# Patient Record
Sex: Female | Born: 1955 | Hispanic: Yes | Marital: Married | Smoking: Never smoker
Health system: Southern US, Community
[De-identification: ages and names within clinical notes are randomized; demographics above are authoritative.]

## PROBLEM LIST (undated history)

## (undated) DIAGNOSIS — L509 Urticaria, unspecified: Secondary | ICD-10-CM

## (undated) DIAGNOSIS — T7840XA Allergy, unspecified, initial encounter: Secondary | ICD-10-CM

## (undated) DIAGNOSIS — K5792 Diverticulitis of intestine, part unspecified, without perforation or abscess without bleeding: Secondary | ICD-10-CM

## (undated) DIAGNOSIS — J45909 Unspecified asthma, uncomplicated: Secondary | ICD-10-CM

## (undated) DIAGNOSIS — I10 Essential (primary) hypertension: Secondary | ICD-10-CM

## (undated) HISTORY — DX: Urticaria, unspecified: L50.9

## (undated) HISTORY — DX: Diverticulitis of intestine, part unspecified, without perforation or abscess without bleeding: K57.92

## (undated) HISTORY — PX: TONSILLECTOMY: SUR1361

## (undated) HISTORY — DX: Essential (primary) hypertension: I10

## (undated) HISTORY — DX: Allergy, unspecified, initial encounter: T78.40XA

## (undated) HISTORY — DX: Unspecified asthma, uncomplicated: J45.909

---

## 2009-01-26 HISTORY — PX: ABDOMINAL HYSTERECTOMY: SHX81

## 2016-06-17 ENCOUNTER — Ambulatory Visit (INDEPENDENT_AMBULATORY_CARE_PROVIDER_SITE_OTHER): Payer: TRICARE For Life (TFL) | Admitting: Emergency Medicine

## 2016-06-17 VITALS — BP 130/80 | HR 72 | Temp 98.2°F | Resp 16 | Ht 60.0 in | Wt 150.2 lb

## 2016-06-17 DIAGNOSIS — E785 Hyperlipidemia, unspecified: Secondary | ICD-10-CM | POA: Diagnosis not present

## 2016-06-17 DIAGNOSIS — I10 Essential (primary) hypertension: Secondary | ICD-10-CM | POA: Diagnosis not present

## 2016-06-17 DIAGNOSIS — J3089 Other allergic rhinitis: Secondary | ICD-10-CM | POA: Diagnosis not present

## 2016-06-17 DIAGNOSIS — Z23 Encounter for immunization: Secondary | ICD-10-CM

## 2016-06-17 NOTE — Progress Notes (Addendum)
Subjective:    Patient ID: Lorraine Henderson, female    DOB: 01/29/1956, 60 y.o.   MRN: [id number]  HPI Here for referral to Allergist and to establish herself with PCP. Has no complaints. Stable chronic medical problems. HTN and dyslipidemia. Has chronic environmental-seasonal allergies.  Past Medical History:  Diagnosis Date  . Allergy   . Diverticulitis    colonoscopy 2 years ago: normal  . Hypertension    Past Surgical History:  Procedure Laterality Date  . ABDOMINAL HYSTERECTOMY    . TONSILLECTOMY Bilateral    Family History  Problem Relation Age of Onset  . Cancer Mother   . Diabetes Father   . Stroke Sister    Social History   Social History  . Marital status: Married    Spouse name: N/A  . Number of children: N/A  . Years of education: N/A   Occupational History  . Not on file.   Social History Main Topics  . Smoking status: Never Smoker  . Smokeless tobacco: Never Used  . Alcohol use No  . Drug use: No  . Sexual activity: Not on file   Other Topics Concern  . Not on file   Social History Narrative  . No narrative on file     Review of Systems Review of Systems  Constitutional: Negative.  Negative for activity change, appetite change, chills, fatigue and fever.  HENT: Negative.  Negative for congestion, ear pain, hearing loss, nosebleeds, sinus pain, sinus pressure, sore throat and trouble swallowing.   Eyes: Negative.  Negative for photophobia, pain and visual disturbance.  Respiratory: Negative.  Negative for cough, chest tightness and shortness of breath.   Cardiovascular: Negative.  Negative for chest pain, palpitations and leg swelling.  Gastrointestinal: Negative.  Negative for abdominal pain, blood in stool, nausea and vomiting.  Endocrine: Negative.  Negative for polydipsia, polyphagia and polyuria.  Genitourinary: Negative.  Negative for difficulty urinating, dysuria, frequency, hematuria and pelvic pain.  Musculoskeletal: Negative.   Negative for arthralgias, joint swelling, myalgias and neck pain.  Skin: Negative.  Negative for color change and rash.  Allergic/Immunologic: Positive for environmental allergies. Negative for food allergies.  Neurological: Negative.  Negative for dizziness, syncope, weakness and headaches.  Hematological: Negative.  Negative for adenopathy. Does not bruise/bleed easily.  Psychiatric/Behavioral: Negative.      Objective:   Physical Exam  Constitutional: She is oriented to person, place, and time. She appears well-developed and well-nourished. She is active and cooperative. No distress.  BP 130/80 (BP Location: Right Arm, Patient Position: Sitting, Cuff Size: Small)   Pulse 72   Temp 98.2 F (36.8 C) (Oral)   Resp 16   Ht 5' (1.524 m)   Wt 150 lb 3.2 oz (68.1 kg)   SpO2 98%   BMI 29.33 kg/m    HENT:  Head: Normocephalic and atraumatic.  Right Ear: Hearing normal.  Left Ear: Hearing normal.  Nose: Nose normal.  Mouth/Throat: Uvula is midline, oropharynx is clear and moist and mucous membranes are normal.  Eyes: Conjunctivae, EOM and lids are normal. Pupils are equal, round, and reactive to light.  Neck: Trachea normal and normal range of motion. Neck supple. Normal carotid pulses and no JVD present. Carotid bruit is not present. No thyroid mass and no thyromegaly present.  Cardiovascular: Normal rate, regular rhythm and normal heart sounds.   Pulmonary/Chest: Effort normal and breath sounds normal.  Abdominal: Soft. Normal appearance. There is no hepatosplenomegaly. There is no tenderness. There is no rebound  and no CVA tenderness.  Neurological: She is alert and oriented to person, place, and time. No cranial nerve deficit or sensory deficit.  Skin: Skin is warm and dry.  Psychiatric: She has a normal mood and affect. Her speech is not slurred.     BP 130/80 (BP Location: Right Arm, Patient Position: Sitting, Cuff Size: Small)   Pulse 72   Temp 98.2 F (36.8 C) (Oral)   Resp  16   Ht 5' (1.524 m)   Wt 150 lb 3.2 oz (68.1 kg)   SpO2 98%   BMI 29.33 kg/m   Physical Exam  Constitutional: She is oriented to person, place, and time. She appears well-developed and well-nourished.  HENT:  Head: Normocephalic and atraumatic.  Mouth/Throat: Oropharynx is clear and moist.  Eyes: Conjunctivae and EOM are normal. Pupils are equal, round, and reactive to light.  Neck: Normal range of motion. Neck supple. No JVD present. No thyromegaly present.  Cardiovascular: Normal rate, regular rhythm and normal heart sounds.  Exam reveals no gallop.   No murmur heard. Pulmonary/Chest: Effort normal and breath sounds normal. No respiratory distress. She has no wheezes. She has no rales.  Abdominal: Soft. Bowel sounds are normal. She exhibits no mass. There is no tenderness.  Musculoskeletal: Normal range of motion.  Neurological: She is alert and oriented to person, place, and time.  Skin: Skin is warm and dry.        Assessment & Plan:  Marylene Landngela was seen today for establish care.  Diagnoses and all orders for this visit:  Environmental and seasonal allergies -     Ambulatory referral to Allergy  Need for prophylactic vaccination and inoculation against influenza -     Flu Vaccine QUAD 36+ mos PF IM (Fluarix & Fluzone Quad PF)  Essential hypertension  Dyslipidemia

## 2016-06-17 NOTE — Progress Notes (Deleted)
Subjective:     Patient ID: Lorraine Henderson, female   DOB: July 31, 1955, 60 y.o.   MRN: [id number]  Needs referral to Allergist. Has no complaints.     Review of Systems  Constitutional: Negative.  Negative for activity change, appetite change, chills, fatigue and fever.  HENT: Negative.  Negative for congestion, ear pain, hearing loss, nosebleeds, sinus pain, sinus pressure, sore throat and trouble swallowing.   Eyes: Negative.  Negative for photophobia, pain and visual disturbance.  Respiratory: Negative.  Negative for cough, chest tightness and shortness of breath.   Cardiovascular: Negative.  Negative for chest pain, palpitations and leg swelling.  Gastrointestinal: Negative.  Negative for abdominal pain, blood in stool, nausea and vomiting.  Endocrine: Negative.  Negative for polydipsia, polyphagia and polyuria.  Genitourinary: Negative.  Negative for difficulty urinating, dysuria, frequency, hematuria and pelvic pain.  Musculoskeletal: Negative.  Negative for arthralgias, joint swelling, myalgias and neck pain.  Skin: Negative.  Negative for color change and rash.  Allergic/Immunologic: Positive for environmental allergies. Negative for food allergies.  Neurological: Negative.  Negative for dizziness, syncope, weakness and headaches.  Hematological: Negative.  Negative for adenopathy. Does not bruise/bleed easily.  Psychiatric/Behavioral: Negative.        Objective:   Physical Exam  Constitutional: She is oriented to person, place, and time. She appears well-developed and well-nourished.  HENT:  Head: Normocephalic and atraumatic.  Mouth/Throat: Oropharynx is clear and moist.  Eyes: Conjunctivae and EOM are normal. Pupils are equal, round, and reactive to light.  Neck: Normal range of motion. Neck supple. No JVD present. No thyromegaly present.  Cardiovascular: Normal rate, regular rhythm and normal heart sounds.  Exam reveals no gallop.   No murmur heard. Pulmonary/Chest:  Effort normal and breath sounds normal. No respiratory distress. She has no wheezes. She has no rales.  Abdominal: Soft. Bowel sounds are normal. She exhibits no mass. There is no tenderness.  Musculoskeletal: Normal range of motion.  Neurological: She is alert and oriented to person, place, and time.  Skin: Skin is warm and dry.       Assessment:     ***    Plan:     ***

## 2016-06-24 ENCOUNTER — Ambulatory Visit: Payer: Self-pay | Admitting: Allergy & Immunology

## 2016-06-24 ENCOUNTER — Ambulatory Visit (INDEPENDENT_AMBULATORY_CARE_PROVIDER_SITE_OTHER): Payer: TRICARE For Life (TFL) | Admitting: Allergy and Immunology

## 2016-06-24 ENCOUNTER — Encounter: Payer: Self-pay | Admitting: Allergy and Immunology

## 2016-06-24 VITALS — BP 110/64 | HR 72 | Temp 98.1°F | Resp 20 | Ht 59.33 in | Wt 148.8 lb

## 2016-06-24 DIAGNOSIS — J3089 Other allergic rhinitis: Secondary | ICD-10-CM | POA: Diagnosis not present

## 2016-06-24 DIAGNOSIS — Z91013 Allergy to seafood: Secondary | ICD-10-CM | POA: Diagnosis not present

## 2016-06-24 DIAGNOSIS — Z91012 Allergy to eggs: Secondary | ICD-10-CM

## 2016-06-24 NOTE — Progress Notes (Signed)
Dear Dr. Alvy BimlerSagardia,  Thank you for referring Lorraine Henderson to the [facility] [place] on 06/24/2016.   Below is a summation of this patient's evaluation and recommendations.  Thank you for your referral. I will keep you informed about this patient's response to treatment.   If you have any questions please do not hesitate to contact me.   Sincerely,  Jessica PriestEric J. Kozlow, MD  [facility]   ______________________________________________________________________    NEW PATIENT NOTE  Referring Provider: Georgina QuintSagardia, Miguel Jose, * Primary Provider: Georgina QuintMiguel Jose Sagardia, MD Date of office visit: 06/24/2016    Subjective:   Chief Complaint:  Lorraine Henderson (DOB: 1955/11/16) is a 60 y.o. female who presents to the clinic on 06/24/2016 with a chief complaint of sinus problems (itching, runny nose, stopped up) and Urticaria .     HPI: Lorraine Henderson presents to this clinic in evaluation of allergies.  She has a long history of allergic disease manifested as nasal symptoms with nasal congestion and sneezing but also with diffuse itchiness and apparently some degree of food hypersensitivity for which she was evaluated by an allergist while living in [place] and started on immunotherapy approximately 2-1/2 years ago. Her immunotherapy has progressed well and she is now receiving this form of therapy every 4 weeks. This has helped with her pruritic disorder and has also helped with her nasal disorder.  She remains away from egg consumption and shellfish consumption. She's been without these food products at least 5 years. With the consumption of shellfish she has developed swelling of her skin and throat and tongue. Egg consumption gave her itchiness and sneezing. She does have an EpiPen.  Lorraine Henderson has moved from [place] to the [place] area about 2-1/2 weeks ago. She is interested in continuing on her  immunotherapy.  Past Medical History:  Diagnosis Date  . Allergy   . Diverticulitis    colonoscopy 2 years ago: normal  . Hypertension   . Urticaria     Past Surgical History:  Procedure Laterality Date  . ABDOMINAL HYSTERECTOMY    . CESAREAN SECTION  1991 and 1996  . TONSILLECTOMY Bilateral     Allergies as of 06/24/2016      Reactions   Aloe Vera Swelling   Aspirin Swelling   Shrimp [shellfish Allergy] Swelling   Benicar Hct [olmesartan Medoxomil-hctz] Swelling      Medication List      atorvastatin 40 MG tablet Commonly known as:  LIPITOR Take 40 mg by mouth daily.   EPINEPHrine 0.3 mg/0.3 mL Soaj injection Commonly known as:  EPI-PEN Inject 0.3 mg into the muscle once.   fexofenadine 180 MG tablet Commonly known as:  ALLEGRA Take 180 mg by mouth daily.   hydrochlorothiazide 25 MG tablet Commonly known as:  HYDRODIURIL Take 25 mg by mouth daily.       Review of systems negative except as noted in HPI / PMHx or noted below:  Review of Systems  Constitutional: Negative.   HENT: Negative.   Eyes: Negative.   Respiratory: Negative.   Cardiovascular: Negative.   Gastrointestinal: Negative.   Genitourinary: Negative.   Musculoskeletal: Negative.   Skin: Negative.   Neurological: Negative.   Endo/Heme/Allergies: Negative.   Psychiatric/Behavioral: Negative.     Family History  Problem Relation Age of Onset  . Cancer Mother   . Diabetes Father   . Stroke Sister     Social History  Social History  . Marital status: Married    Spouse name: N/A  . Number of children: N/A  . Years of education: N/A   Occupational History  . Not on file.   Social History Main Topics  . Smoking status: Never Smoker  . Smokeless tobacco: Never Used  . Alcohol use No  . Drug use: No  . Sexual activity: Not on file   Other Topics Concern  . Not on file   Social History Narrative  . No narrative on file    Environmental and Social history  Lives in  a apartment with a dry environment, a dog located inside the household, rugs in the bedroom, no plastic on the bed or pillow, and no smoking ongoing with inside the household.  Objective:   Vitals:   06/24/16 1338  BP: 110/64  Pulse: 72  Resp: 20  Temp: 98.1 F (36.7 C)   Height: 4' 11.33" (150.7 cm) Weight: 148 lb 12.8 oz (67.5 kg)  Physical Exam  Constitutional: She is well-developed, well-nourished, and in no distress.  HENT:  Head: Normocephalic. Head is without right periorbital erythema and without left periorbital erythema.  Right Ear: Tympanic membrane, external ear and ear canal normal.  Left Ear: Tympanic membrane, external ear and ear canal normal.  Nose: Nose normal. No mucosal edema or rhinorrhea.  Mouth/Throat: Uvula is midline, oropharynx is clear and moist and mucous membranes are normal. No oropharyngeal exudate.  Eyes: Conjunctivae and lids are normal. Pupils are equal, round, and reactive to light.  Neck: Trachea normal. No tracheal tenderness present. No tracheal deviation present. No thyromegaly present.  Cardiovascular: Normal rate, regular rhythm, S1 normal, S2 normal and normal heart sounds.   No murmur heard. Pulmonary/Chest: Effort normal and breath sounds normal. No stridor. No tachypnea. No respiratory distress. She has no wheezes. She has no rales. She exhibits no tenderness.  Abdominal: Soft. She exhibits no distension and no mass. There is no hepatosplenomegaly. There is no tenderness. There is no rebound and no guarding.  Musculoskeletal: She exhibits no edema or tenderness.  Lymphadenopathy:       Head (right side): No tonsillar adenopathy present.       Head (left side): No tonsillar adenopathy present.    She has no cervical adenopathy.    She has no axillary adenopathy.  Neurological: She is alert. Gait normal.  Skin: No rash noted. She is not diaphoretic. No erythema. No pallor. Nails show no clubbing.  Psychiatric: Mood and affect normal.     Diagnostics: Allergy skin tests were not performed.   Assessment and Plan:    1. Other allergic rhinitis   2. Egg allergy   3. Shellfish allergy     1. Continue immunotherapy from [place] allergist every month  2. Continue EpiPen if needed  3. Continue OTC antihistamine if needed  4. Return to clinic 1 year or earlier if problem  At this point in time Lorraine Henderson will continue to use immunotherapy as formulated by her allergist in [place]. If for some reason she does not do well after living in this area for a point in time we will consider having her start immunotherapy more specific to this area. Of course she is going to avoid egg and shellfish consumption based upon her previous reaction to these food substances. I will see her back in this clinic in 1 year or earlier if there is a problem.  Jessica PriestEric J. Kozlow, MD [facility] [place]  [place]

## 2016-06-24 NOTE — Patient Instructions (Addendum)
  1. Continue immunotherapy from [place] allergist every month  2. Continue EpiPen if needed  3. Continue OTC antihistamine if needed  4. Return to clinic 1 year or earlier if problem

## 2016-07-05 ENCOUNTER — Ambulatory Visit (INDEPENDENT_AMBULATORY_CARE_PROVIDER_SITE_OTHER): Payer: TRICARE For Life (TFL) | Admitting: *Deleted

## 2016-07-05 DIAGNOSIS — J3089 Other allergic rhinitis: Secondary | ICD-10-CM | POA: Diagnosis not present

## 2016-07-21 ENCOUNTER — Ambulatory Visit (INDEPENDENT_AMBULATORY_CARE_PROVIDER_SITE_OTHER): Payer: TRICARE For Life (TFL)

## 2016-07-21 DIAGNOSIS — J309 Allergic rhinitis, unspecified: Secondary | ICD-10-CM

## 2016-07-27 ENCOUNTER — Ambulatory Visit: Payer: Self-pay | Admitting: Allergy and Immunology

## 2016-08-05 ENCOUNTER — Ambulatory Visit (INDEPENDENT_AMBULATORY_CARE_PROVIDER_SITE_OTHER): Payer: TRICARE For Life (TFL)

## 2016-08-05 DIAGNOSIS — J309 Allergic rhinitis, unspecified: Secondary | ICD-10-CM

## 2016-08-24 ENCOUNTER — Ambulatory Visit (INDEPENDENT_AMBULATORY_CARE_PROVIDER_SITE_OTHER): Payer: TRICARE For Life (TFL) | Admitting: *Deleted

## 2016-08-24 DIAGNOSIS — J309 Allergic rhinitis, unspecified: Secondary | ICD-10-CM | POA: Diagnosis not present

## 2016-09-10 ENCOUNTER — Ambulatory Visit (INDEPENDENT_AMBULATORY_CARE_PROVIDER_SITE_OTHER): Payer: TRICARE For Life (TFL) | Admitting: *Deleted

## 2016-09-10 DIAGNOSIS — J309 Allergic rhinitis, unspecified: Secondary | ICD-10-CM

## 2016-09-20 ENCOUNTER — Ambulatory Visit (INDEPENDENT_AMBULATORY_CARE_PROVIDER_SITE_OTHER): Payer: TRICARE For Life (TFL) | Admitting: Emergency Medicine

## 2016-09-20 ENCOUNTER — Encounter: Payer: Self-pay | Admitting: Emergency Medicine

## 2016-09-20 VITALS — BP 156/89 | HR 83 | Resp 16 | Wt 148.0 lb

## 2016-09-20 DIAGNOSIS — I10 Essential (primary) hypertension: Secondary | ICD-10-CM

## 2016-09-20 DIAGNOSIS — E785 Hyperlipidemia, unspecified: Secondary | ICD-10-CM | POA: Diagnosis not present

## 2016-09-20 MED ORDER — ATORVASTATIN CALCIUM 40 MG PO TABS: 40.0000 mg | ORAL_TABLET | Freq: Every day | ORAL | 3 refills | Status: AC

## 2016-09-20 MED ORDER — HYDROCHLOROTHIAZIDE 25 MG PO TABS: 25.0000 mg | ORAL_TABLET | Freq: Every day | ORAL | 3 refills | Status: AC

## 2016-09-20 NOTE — Progress Notes (Signed)
Lorraine Henderson 61 y.o.   Chief Complaint  Patient presents with  . Follow-up    b/p check    HISTORY OF PRESENT ILLNESS: This is a 61 y.o. female here for BP check; no significant complaints. Doing well.  HPI   Prior to Admission medications   Medication Sig Start Date End Date Taking? Authorizing Provider  atorvastatin (LIPITOR) 40 MG tablet Take 1 tablet (40 mg total) by mouth daily. 09/20/16 12/19/16 Yes Lorraine Linz Victorino December, MD  EPINEPHrine 0.3 mg/0.3 mL IJ SOAJ injection Inject 0.3 mg into the muscle once.   Yes Historical Provider, MD  fexofenadine (ALLEGRA) 180 MG tablet Take 180 mg by mouth daily.   Yes Historical Provider, MD  hydrochlorothiazide (HYDRODIURIL) 25 MG tablet Take 1 tablet (25 mg total) by mouth daily. 09/20/16 12/19/16 Yes Lorraine Blevens Victorino December, MD    Allergies  Allergen Reactions  . Aloe Vera Swelling  . Aspirin Swelling  . Shrimp [Shellfish Allergy] Swelling  . Benicar Hct [Olmesartan Medoxomil-Hctz] Swelling    There are no active problems to display for this patient.   Past Medical History:  Diagnosis Date  . Allergy   . Diverticulitis    colonoscopy 2 years ago: normal  . Hypertension   . Urticaria     Past Surgical History:  Procedure Laterality Date  . ABDOMINAL HYSTERECTOMY    . CESAREAN SECTION  1991 and 1996  . TONSILLECTOMY Bilateral     Social History   Social History  . Marital status: Married    Spouse name: N/A  . Number of children: N/A  . Years of education: N/A   Occupational History  . Not on file.   Social History Main Topics  . Smoking status: Never Smoker  . Smokeless tobacco: Never Used  . Alcohol use No  . Drug use: No  . Sexual activity: Not on file   Other Topics Concern  . Not on file   Social History Narrative  . No narrative on file    Family History  Problem Relation Age of Onset  . Cancer Mother   . Diabetes Father   . Stroke Sister      Review of Systems  Constitutional: Negative.     HENT: Negative.   Eyes: Negative.   Respiratory: Negative.   Cardiovascular: Negative.   Gastrointestinal: Negative.   Genitourinary: Negative.   Musculoskeletal: Positive for joint pain (right knee pain).  Skin: Negative.   Neurological: Negative.   Endo/Heme/Allergies: Negative.   Psychiatric/Behavioral: Negative.   All other systems reviewed and are negative.  Vitals:   09/20/16 1028  BP: (!) 156/89  Pulse: 83  Resp: 16     Physical Exam  Constitutional: She is oriented to person, place, and time. She appears well-developed and well-nourished.  HENT:  Head: Normocephalic and atraumatic.  Nose: Nose normal.  Mouth/Throat: Oropharynx is clear and moist.  Eyes: Conjunctivae and EOM are normal. Pupils are equal, round, and reactive to light.  Neck: Normal range of motion. Neck supple. No JVD present. No thyromegaly present.  Cardiovascular: Normal rate, regular rhythm and normal heart sounds.   Pulmonary/Chest: Effort normal and breath sounds normal.  Abdominal: Soft. Bowel sounds are normal.  Musculoskeletal: Normal range of motion.  Right knee: no swelling or tenderness; FROM; stable in flexion and extension.  Lymphadenopathy:    She has no cervical adenopathy.  Neurological: She is alert and oriented to person, place, and time. She displays normal reflexes. No sensory deficit. She exhibits normal  muscle tone.  Skin: Skin is warm and dry.  Psychiatric: She has a normal mood and affect. Her behavior is normal.  Vitals reviewed.    ASSESSMENT & PLAN: Lorraine Landngela was seen today for follow-up.  Diagnoses and all orders for this visit:  Essential hypertension -     Comprehensive metabolic panel -     CBC with Differential/Platelet -     TSH -     Lipid panel  Dyslipidemia  Other orders -     hydrochlorothiazide (HYDRODIURIL) 25 MG tablet; Take 1 tablet (25 mg total) by mouth daily. -     atorvastatin (LIPITOR) 40 MG tablet; Take 1 tablet (40 mg total) by mouth  daily.    Patient Instructions       IF you received an x-ray today, you will receive an invoice from [facility]. Please contact [facility] at [phone] with questions or concerns regarding your invoice.   IF you received labwork today, you will receive an invoice from [facility]. Please contact [facility] at [phone] with questions or concerns regarding your invoice.   Our billing staff will not be able to assist you with questions regarding bills from these companies.  You will be contacted with the lab results as soon as they are available. The fastest way to get your results is to activate your My Chart account. Instructions are located on the last page of this paperwork. If you have not heard from us regarding the results in 2 weeks, please contact this office.     Hipertensin Hypertension El trmino hipertensin es otra forma de denominar a la presin arterial elevada. La presin arterial elevada fuerza al corazn a trabajar ms para bombear la sangre. Esto puede causar problemas con el paso del [place]. Una lectura de presin arterial est compuesta por 2 nmeros. Hay un nmero superior (sistlico) sobre un nmero inferior (diastlico). Lo ideal es tener la presin arterial por debajo de 120/80. Las decisiones saludables pueden ayudarle a disminuir su presin arterial. Es posible que necesite medicamentos que le ayuden a disminuir su presin arterial si:  Su presin arterial no disminuye mediante decisiones saludables.  Su presin arterial est por encima de 130/80. Siga estas instrucciones en su casa: Comida y bebida   Si se lo indican, siga el plan de alimentacin de DASH (Dietary Approaches to Stop Hypertension, Maneras de alimentarse para detener la hipertensin). Esta dieta incluye:  Que la mitad del plato de cada comida sea de frutas y verduras.  Que un cuarto del plato de cada comida sea de cereales integrales. Los cereales integrales incluyen  pasta integral, arroz integral y pan integral.  Comer y beber productos lcteos con bajo contenido de [place], como leche descremada o yogur bajo en grasas.  Que un cuarto del plato de cada comida sea de protenas bajas en grasa [place](magras). Las protenas bajas en grasa incluyen pescado, pollo sin piel, huevos, frijoles y tofu.  Evitar consumir carne grasa, carne curada y procesada, o pollo con piel.  Evitar consumir alimentos prehechos o procesados.  Consuma menos de 1500 mg de sal (sodio) por da.  Limite el consumo de alcohol a no ms de 1 medida por da si es mujer y no est Orthoptistembarazada y a 2 medidas por da si es hombre. Una medida equivale a 12onzas de cerveza, 5onzas de vino o 1onzas de bebidas alcohlicas de alta graduacin. Estilo de vida   Trabaje con su mdico para mantenerse en un peso saludable o para perder peso. Pregntele a  su mdico cul es el peso recomendable para usted.  Realice al menos 30 minutos de ejercicio que haga que se acelere su corazn (ejercicio Magazine features editor) la [employer] de la [place]. Estos pueden incluir caminar, nadar o andar en bicicleta.  Realice al menos 30 minutos de ejercicio que fortalezca sus msculos (ejercicios de resistencia) al menos 3 das a la [place]. Estos pueden incluir levantar pesas o hacer pilates.  No consuma ningn producto que contenga nicotina o tabaco. Esto incluye cigarrillos y cigarrillos electrnicos. Si necesita ayuda para dejar de fumar, consulte al [employer].  Controle su presin arterial en su casa tal como le indic el mdico.  Concurra a todas las visitas de control como se lo haya indicado el mdico. Esto es importante. Medicamentos   [employer] de venta libre y los recetados solamente como se lo haya indicado el mdico. Siga cuidadosamente las indicaciones.  No omita las dosis de medicamentos para la presin arterial. Los medicamentos pierden eficacia si omite dosis. El hecho de omitir las dosis tambin  [place] el riesgo de otros problemas.  Pregntele a su mdico a qu efectos secundarios o reacciones a los Museum/gallery curator. Comunquese con un mdico si:  Piensa que tiene [place] reaccin a los medicamentos que est tomando.  Tiene dolores de cabeza frecuentes (recurrentes).  Siente mareos.  Tiene hinchazn en los tobillos.  Tiene problemas de visin. Solicite ayuda de inmediato si:  Siente un dolor de cabeza muy intenso.  Comienza a sentirse confundido.  Se siente dbil o adormecido.  Siente que va a desmayarse.  Siente un dolor muy intenso en:  El pecho.  El vientre (abdomen).  Devuelve (vomita) ms de una vez.  Tiene dificultad para respirar. Resumen  El trmino hipertensin es otra forma de denominar a la presin arterial elevada.  Las decisiones saludables pueden ayudarle a disminuir su presin arterial. Si no puede controlar su presin arterial mediante decisiones saludables, es posible que deba tomar medicamentos. Esta informacin no tiene Theme park manager el consejo del mdico. Asegrese de hacerle al mdico cualquier pregunta que tenga. Document Released: 12/02/2009 Document Revised: 05/26/2016 Document Reviewed: 05/26/2016 Elsevier Interactive Patient Education  2017 Elsevier Inc.      Edwina Barth, MD [facility]

## 2016-09-20 NOTE — Patient Instructions (Addendum)
IF you received an x-ray today, you will receive an invoice from [facility]. Please contact [facility] at [phone] with questions or concerns regarding your invoice.   IF you received labwork today, you will receive an invoice from [facility]. Please contact [facility] at [phone] with questions or concerns regarding your invoice.   Our billing staff will not be able to assist you with questions regarding bills from these companies.  You will be contacted with the lab results as soon as they are available. The fastest way to get your results is to activate your My Chart account. Instructions are located on the last page of this paperwork. If you have not heard from us regarding the results in 2 weeks, please contact this office.     Hipertensin Hypertension El trmino hipertensin es otra forma de denominar a la presin arterial elevada. La presin arterial elevada fuerza al corazn a trabajar ms para bombear la sangre. Esto puede causar problemas con el paso del [place]po. Una lectura de presin arterial est compuesta por 2 nmeros. Hay un nmero superior (sistlico) sobre un nmero inferior (diastlico). Lo ideal es tener la presin arterial por debajo de 120/80. Las decisiones saludables pueden ayudarle a disminuir su presin arterial. Es posible que necesite medicamentos que le ayuden a disminuir su presin arterial si:  Su presin arterial no disminuye mediante decisiones saludables.  Su presin arterial est por encima de 130/80. Siga estas instrucciones en su casa: Comida y bebida   Si se lo indican, siga el plan de alimentacin de DASH (Dietary Approaches to Stop Hypertension, Maneras de alimentarse para detener la hipertensin). Esta dieta incluye:  Que la mitad del plato de cada comida sea de frutas y verduras.  Que un cuarto del plato de cada comida sea de cereales integrales. Los cereales integrales incluyen pasta integral, arroz integral y pan  integral.  Comer y beber productos lcteos con bajo contenido de [place]rasa, como leche descremada o yogur bajo en grasas.  Que un cuarto del plato de cada comida sea de protenas bajas en grasa [place](magras). Las protenas bajas en grasa incluyen pescado, pollo sin piel, huevos, frijoles y tofu.  Evitar consumir carne grasa, carne curada y procesada, o pollo con piel.  Evitar consumir alimentos prehechos o procesados.  Consuma menos de 1500 mg de sal (sodio) por da.  Limite el consumo de alcohol a no ms de 1 medida por da si es mujer y no est Orthoptistembarazada y a 2 medidas por da si es hombre. Una medida equivale a 12onzas de cerveza, 5onzas de vino o 1onzas de bebidas alcohlicas de alta graduacin. Estilo de vida   Trabaje con su mdico para mantenerse en un peso saludable o para perder peso. Pregntele a su mdico cul es el peso recomendable para usted.  Realice al menos 30 minutos de ejercicio que haga que se acelere su corazn (ejercicio Magazine features editoraerbico) la DIRECTVmayora de los das de la [place]. Estos pueden incluir caminar, nadar o andar en bicicleta.  Realice al menos 30 minutos de ejercicio que fortalezca sus msculos (ejercicios de resistencia) al menos 3 das a la [place]. Estos pueden incluir levantar pesas o hacer pilates.  No consuma ningn producto que contenga nicotina o tabaco. Esto incluye cigarrillos y cigarrillos electrnicos. Si necesita ayuda para dejar de fumar, consulte al mdico.  Controle su presin arterial en su casa tal como le indic el mdico.  Concurra a todas las visitas de control como se lo haya indicado el mdico. Esto  es importante. Medicamentos   Bax[employer] Internationalome los medicamentos de venta libre y los recetados solamente como se lo haya indicado el mdico. Siga cuidadosamente las indicaciones.  No omita las dosis de medicamentos para la presin arterial. Los medicamentos pierden eficacia si omite dosis. El hecho de omitir las dosis tambin [place]umenta el riesgo de otros  problemas.  Pregntele a su mdico a qu efectos secundarios o reacciones a los Museum/gallery curatormedicamentos debe prestar atencin. Comunquese con un mdico si:  Piensa que tiene [place] reaccin a los medicamentos que est tomando.  Tiene dolores de cabeza frecuentes (recurrentes).  Siente mareos.  Tiene hinchazn en los tobillos.  Tiene problemas de visin. Solicite ayuda de inmediato si:  Siente un dolor de cabeza muy intenso.  Comienza a sentirse confundido.  Se siente dbil o adormecido.  Siente que va a desmayarse.  Siente un dolor muy intenso en:  El pecho.  El vientre (abdomen).  Devuelve (vomita) ms de una vez.  Tiene dificultad para respirar. Resumen  El trmino hipertensin es otra forma de denominar a la presin arterial elevada.  Las decisiones saludables pueden ayudarle a disminuir su presin arterial. Si no puede controlar su presin arterial mediante decisiones saludables, es posible que deba tomar medicamentos. Esta informacin no tiene Theme park managercomo fin reemplazar el consejo del mdico. Asegrese de hacerle al mdico cualquier pregunta que tenga. Document Released: 12/02/2009 Document Revised: 05/26/2016 Document Reviewed: 05/26/2016 Elsevier Interactive Patient Education  2017 [employer]

## 2016-09-21 LAB — CBC WITH DIFFERENTIAL/PLATELET
BASOS ABS: 0 10*3/uL (ref 0.0–0.2)
Basos: 1 %
EOS (ABSOLUTE): 0.3 10*3/uL (ref 0.0–0.4)
Eos: 4 %
Hematocrit: 42.3 % (ref 34.0–46.6)
Hemoglobin: 14.2 g/dL (ref 11.1–15.9)
IMMATURE GRANULOCYTES: 0 %
Immature Grans (Abs): 0 10*3/uL (ref 0.0–0.1)
Lymphocytes Absolute: 3 10*3/uL (ref 0.7–3.1)
Lymphs: 40 %
MCH: 31.1 pg (ref 26.6–33.0)
MCHC: 33.6 g/dL (ref 31.5–35.7)
MCV: 93 fL (ref 79–97)
MONOS ABS: 0.6 10*3/uL (ref 0.1–0.9)
Monocytes: 8 %
NEUTROS PCT: 47 %
Neutrophils Absolute: 3.5 10*3/uL (ref 1.4–7.0)
PLATELETS: 368 10*3/uL (ref 150–379)
RBC: 4.56 x10E6/uL (ref 3.77–5.28)
RDW: 13.2 % (ref 12.3–15.4)
WBC: 7.4 10*3/uL (ref 3.4–10.8)

## 2016-09-21 LAB — COMPREHENSIVE METABOLIC PANEL
A/G RATIO: 1.3 (ref 1.2–2.2)
ALK PHOS: 161 IU/L — AB (ref 39–117)
ALT: 21 IU/L (ref 0–32)
AST: 21 IU/L (ref 0–40)
Albumin: 4.3 g/dL (ref 3.6–4.8)
BUN/Creatinine Ratio: 25 (ref 12–28)
BUN: 20 mg/dL (ref 8–27)
Bilirubin Total: 0.6 mg/dL (ref 0.0–1.2)
CO2: 29 mmol/L (ref 18–29)
Calcium: 9.7 mg/dL (ref 8.7–10.3)
Chloride: 98 mmol/L (ref 96–106)
Creatinine, Ser: 0.81 mg/dL (ref 0.57–1.00)
GFR calc Af Amer: 90 mL/min/{1.73_m2} (ref >59)
GFR calc non Af Amer: 78 mL/min/{1.73_m2} (ref >59)
GLOBULIN, TOTAL: 3.2 g/dL (ref 1.5–4.5)
Glucose: 97 mg/dL (ref 65–99)
POTASSIUM: 4.6 mmol/L (ref 3.5–5.2)
SODIUM: 142 mmol/L (ref 134–144)
Total Protein: 7.5 g/dL (ref 6.0–8.5)

## 2016-09-21 LAB — LIPID PANEL
CHOL/HDL RATIO: 3.8 ratio (ref 0.0–4.4)
CHOLESTEROL TOTAL: 160 mg/dL (ref 100–199)
HDL: 42 mg/dL (ref >39)
LDL Calculated: 78 mg/dL (ref 0–99)
TRIGLYCERIDES: 202 mg/dL — AB (ref 0–149)
VLDL Cholesterol Cal: 40 mg/dL (ref 5–40)

## 2016-09-21 LAB — TSH: TSH: 2.46 u[IU]/mL (ref 0.450–4.500)

## 2016-09-23 ENCOUNTER — Ambulatory Visit (INDEPENDENT_AMBULATORY_CARE_PROVIDER_SITE_OTHER): Payer: TRICARE For Life (TFL) | Admitting: *Deleted

## 2016-09-23 DIAGNOSIS — J309 Allergic rhinitis, unspecified: Secondary | ICD-10-CM | POA: Diagnosis not present

## 2016-10-11 ENCOUNTER — Ambulatory Visit (INDEPENDENT_AMBULATORY_CARE_PROVIDER_SITE_OTHER): Payer: TRICARE For Life (TFL) | Admitting: *Deleted

## 2016-10-11 DIAGNOSIS — J309 Allergic rhinitis, unspecified: Secondary | ICD-10-CM

## 2016-10-21 DIAGNOSIS — J309 Allergic rhinitis, unspecified: Secondary | ICD-10-CM

## 2016-10-21 NOTE — Progress Notes (Signed)
This encounter was created in error - please disregard.

## 2016-10-29 ENCOUNTER — Ambulatory Visit (INDEPENDENT_AMBULATORY_CARE_PROVIDER_SITE_OTHER): Payer: TRICARE For Life (TFL)

## 2016-10-29 DIAGNOSIS — J309 Allergic rhinitis, unspecified: Secondary | ICD-10-CM | POA: Diagnosis not present

## 2016-11-12 ENCOUNTER — Ambulatory Visit (INDEPENDENT_AMBULATORY_CARE_PROVIDER_SITE_OTHER): Payer: TRICARE For Life (TFL)

## 2016-11-12 DIAGNOSIS — J309 Allergic rhinitis, unspecified: Secondary | ICD-10-CM | POA: Diagnosis not present

## 2016-11-24 ENCOUNTER — Ambulatory Visit (INDEPENDENT_AMBULATORY_CARE_PROVIDER_SITE_OTHER): Payer: TRICARE For Life (TFL)

## 2016-11-24 DIAGNOSIS — J309 Allergic rhinitis, unspecified: Secondary | ICD-10-CM

## 2016-12-08 ENCOUNTER — Telehealth: Payer: Self-pay | Admitting: *Deleted

## 2016-12-08 NOTE — Telephone Encounter (Signed)
Called number x 2 times number invalid. Called about needing new vials since hers are expired. Per Herbert SetaHeather we need skin test results or needs to be retested here.

## 2016-12-21 ENCOUNTER — Ambulatory Visit (INDEPENDENT_AMBULATORY_CARE_PROVIDER_SITE_OTHER): Payer: TRICARE For Life (TFL) | Admitting: Allergy and Immunology

## 2016-12-21 ENCOUNTER — Encounter: Payer: Self-pay | Admitting: Allergy and Immunology

## 2016-12-21 VITALS — BP 136/70 | HR 78 | Resp 16 | Ht 59.5 in | Wt 148.6 lb

## 2016-12-21 DIAGNOSIS — Z91013 Allergy to seafood: Secondary | ICD-10-CM

## 2016-12-21 DIAGNOSIS — J3089 Other allergic rhinitis: Secondary | ICD-10-CM

## 2016-12-21 NOTE — Progress Notes (Signed)
Follow-up Note  Referring Provider: Georgina QuintSagardia, Miguel Jose, * Primary Provider: Georgina QuintSagardia, Miguel Jose, MD Date of Office Visit: 12/21/2016  Subjective:   Lorraine Henderson (DOB: May 02, 1956) is a 61 y.o. female [facility] in re-evaluation of the following:  HPI: Lorraine Henderson returns to this clinic in reevaluation of her allergic rhinoconjunctivitis and food allergy. I last saw her in this clinic 06/24/2016 which was her initial evaluation at which point in time we continue to have her receiving immunotherapy from [place]. She is presently receiving this therapy about every 2 weeks. She has not had an adverse effects secondary to the administration of this medication. However, she has had some more problems with nasal congestion and sneezing and some itchy eyes and also finds that she is very itchy when she goes outdoors during the spring since moving to this area.  She can now eat egg without any problem. She still remains away from shellfish consumption. She has an EpiPen.  Allergies as of 12/21/2016      Reactions   Aloe Vera Swelling   Aspirin Swelling   Shrimp [shellfish Allergy] Swelling   Benicar Hct [olmesartan Medoxomil-hctz] Swelling      Medication List      atorvastatin 40 MG tablet Commonly known as:  LIPITOR Take 1 tablet (40 mg total) by mouth daily.   EPINEPHrine 0.3 mg/0.3 mL Soaj injection Commonly known as:  EPI-PEN Inject 0.3 mg into the muscle once.   fexofenadine 180 MG tablet Commonly known as:  ALLEGRA Take 180 mg by mouth daily.   hydrochlorothiazide 25 MG tablet Commonly known as:  HYDRODIURIL Take 1 tablet (25 mg total) by mouth daily.       Past Medical History:  Diagnosis Date  . Allergy   . Diverticulitis    colonoscopy 2 years ago: normal  . Hypertension   . Urticaria     Past Surgical History:  Procedure Laterality Date  . ABDOMINAL HYSTERECTOMY    . CESAREAN SECTION  1991  and 1996  . TONSILLECTOMY Bilateral     Review of systems negative except as noted in HPI / PMHx or noted below:  Review of Systems  Constitutional: Negative.   HENT: Negative.   Eyes: Negative.   Respiratory: Negative.   Cardiovascular: Negative.   Gastrointestinal: Negative.   Genitourinary: Negative.   Musculoskeletal: Negative.   Skin: Negative.   Neurological: Negative.   Endo/Heme/Allergies: Negative.   Psychiatric/Behavioral: Negative.      Objective:   Vitals:   12/21/16 0759  BP: 136/70  Pulse: 78  Resp: 16   Height: 4' 11.5" (151.1 cm)  Weight: 148 lb 9.6 oz (67.4 kg)   Physical Exam  Constitutional: She is well-developed, well-nourished, and in no distress.  HENT:  Head: Normocephalic.  Right Ear: Tympanic membrane, external ear and ear canal normal.  Left Ear: Tympanic membrane, external ear and ear canal normal.  Nose: Nose normal. No mucosal edema or rhinorrhea.  Mouth/Throat: Uvula is midline, oropharynx is clear and moist and mucous membranes are normal. No oropharyngeal exudate.  Eyes: Conjunctivae are normal.  Neck: Trachea normal. No tracheal tenderness present. No tracheal deviation present. No thyromegaly present.  Cardiovascular: Normal rate, regular rhythm, S1 normal, S2 normal and normal heart sounds.   No murmur heard. Pulmonary/Chest: Breath sounds normal. No stridor. No respiratory distress. She has no wheezes. She has no rales.  Musculoskeletal: She exhibits no edema.  Lymphadenopathy:  Head (right side): No tonsillar adenopathy present.       Head (left side): No tonsillar adenopathy present.    She has no cervical adenopathy.  Neurological: She is alert. Gait normal.  Skin: No rash noted. She is not diaphoretic. No erythema. Nails show no clubbing.  Psychiatric: Mood and affect normal.    Diagnostics: She demonstrated hypersensitivity to weeds, trees, and molds.  Assessment and Plan:   1. Other allergic rhinitis   2.  Shellfish allergy     1. Continue immunotherapy with new extract mix  2. Continue EpiPen if needed  3. Continue OTC antihistamine - Claritin/Allegra/Zyrtec - if needed  4. Return to clinic 1 year or earlier if problem  5. Shrimp challenge? - Check shellfish panel  Kaneshia will continue immunotherapy with a new immunotherapy extract and I will see her back in this clinic in approximately one year or earlier if there is a problem. She will obviously remain away from all shellfish consumption at this point until we can work through whether or not she can withstand a shrimp challenge in clinic.  Laurette Schimke, MD Allergy / Immunology [facility]

## 2016-12-21 NOTE — Patient Instructions (Addendum)
  1. Continue immunotherapy with new extract mix  2. Continue EpiPen if needed  3. Continue OTC antihistamine - Claritin/Allegra/Zyrtec - if needed  4. Return to clinic 1 year or earlier if problem  5. Shrimp challenge? - Check shellfish panel

## 2016-12-23 LAB — ALLERGEN PROFILE, SHELLFISH
Clam IgE: <0.1 kU/L
F023-IGE CRAB: 1.36 kU/L — AB
F080-IGE LOBSTER: 0.58 kU/L — AB
F290-IgE Oyster: <0.1 kU/L
SHRIMP IGE: 1.51 kU/L — AB
Scallop IgE: <0.1 kU/L

## 2016-12-30 ENCOUNTER — Other Ambulatory Visit: Payer: Self-pay | Admitting: Allergy and Immunology

## 2016-12-30 DIAGNOSIS — J3089 Other allergic rhinitis: Secondary | ICD-10-CM

## 2016-12-31 ENCOUNTER — Encounter: Payer: Self-pay | Admitting: *Deleted

## 2016-12-31 DIAGNOSIS — J301 Allergic rhinitis due to pollen: Secondary | ICD-10-CM | POA: Diagnosis not present

## 2016-12-31 NOTE — Progress Notes (Signed)
4 VIAL SET MADE. EXP 12-31-17. HC 

## 2017-01-07 ENCOUNTER — Ambulatory Visit (INDEPENDENT_AMBULATORY_CARE_PROVIDER_SITE_OTHER): Payer: TRICARE For Life (TFL)

## 2017-01-07 DIAGNOSIS — J309 Allergic rhinitis, unspecified: Secondary | ICD-10-CM | POA: Diagnosis not present

## 2017-01-10 ENCOUNTER — Telehealth: Payer: Self-pay | Admitting: Allergy and Immunology

## 2017-01-10 ENCOUNTER — Ambulatory Visit (INDEPENDENT_AMBULATORY_CARE_PROVIDER_SITE_OTHER): Payer: TRICARE For Life (TFL) | Admitting: *Deleted

## 2017-01-10 DIAGNOSIS — J309 Allergic rhinitis, unspecified: Secondary | ICD-10-CM | POA: Diagnosis not present

## 2017-01-10 NOTE — Telephone Encounter (Signed)
patient would like a script for CLOTRIMAZOLE 15g - for itching Patients husband has some from when he had poison ivy and she has used it for breakouts and wants a script for her own Please call pt to answer any questions

## 2017-01-10 NOTE — Telephone Encounter (Signed)
Can apply mometasone 0.1% cream to itchy spots one time a day if needed.

## 2017-01-10 NOTE — Telephone Encounter (Signed)
Spoke to patient advised this is used for fungal infections she states she applied it to an area where she had a bug bite and it cleared up after application. Patient would like something sent in for when she gets a rash or itchy on skin Dr Lucie LeatherKozlow please advise

## 2017-01-11 MED ORDER — MOMETASONE FUROATE 0.1 % EX CREA: 1.0000 "application " | TOPICAL_CREAM | Freq: Every day | CUTANEOUS | 2 refills | Status: DC

## 2017-01-11 NOTE — Telephone Encounter (Signed)
Called patient left message informing her of medication sent in

## 2017-01-12 NOTE — Telephone Encounter (Signed)
Spoke with patient notified of script she verbalized understanding

## 2017-01-13 ENCOUNTER — Ambulatory Visit (INDEPENDENT_AMBULATORY_CARE_PROVIDER_SITE_OTHER): Payer: TRICARE For Life (TFL) | Admitting: *Deleted

## 2017-01-13 DIAGNOSIS — J309 Allergic rhinitis, unspecified: Secondary | ICD-10-CM | POA: Diagnosis not present

## 2017-01-17 ENCOUNTER — Ambulatory Visit (INDEPENDENT_AMBULATORY_CARE_PROVIDER_SITE_OTHER): Payer: TRICARE For Life (TFL)

## 2017-01-17 DIAGNOSIS — J309 Allergic rhinitis, unspecified: Secondary | ICD-10-CM | POA: Diagnosis not present

## 2017-01-20 ENCOUNTER — Ambulatory Visit (INDEPENDENT_AMBULATORY_CARE_PROVIDER_SITE_OTHER): Payer: TRICARE For Life (TFL) | Admitting: *Deleted

## 2017-01-20 DIAGNOSIS — J309 Allergic rhinitis, unspecified: Secondary | ICD-10-CM | POA: Diagnosis not present

## 2017-01-24 ENCOUNTER — Ambulatory Visit (INDEPENDENT_AMBULATORY_CARE_PROVIDER_SITE_OTHER)

## 2017-01-24 DIAGNOSIS — J309 Allergic rhinitis, unspecified: Secondary | ICD-10-CM | POA: Diagnosis not present

## 2017-02-07 ENCOUNTER — Ambulatory Visit (INDEPENDENT_AMBULATORY_CARE_PROVIDER_SITE_OTHER)

## 2017-02-07 DIAGNOSIS — J309 Allergic rhinitis, unspecified: Secondary | ICD-10-CM

## 2017-02-10 ENCOUNTER — Ambulatory Visit (INDEPENDENT_AMBULATORY_CARE_PROVIDER_SITE_OTHER): Admitting: *Deleted

## 2017-02-10 DIAGNOSIS — J309 Allergic rhinitis, unspecified: Secondary | ICD-10-CM | POA: Diagnosis not present

## 2017-02-15 ENCOUNTER — Ambulatory Visit (INDEPENDENT_AMBULATORY_CARE_PROVIDER_SITE_OTHER): Admitting: *Deleted

## 2017-02-15 DIAGNOSIS — J309 Allergic rhinitis, unspecified: Secondary | ICD-10-CM | POA: Diagnosis not present

## 2017-02-16 ENCOUNTER — Ambulatory Visit (INDEPENDENT_AMBULATORY_CARE_PROVIDER_SITE_OTHER): Admitting: Emergency Medicine

## 2017-02-16 ENCOUNTER — Encounter: Payer: Self-pay | Admitting: Emergency Medicine

## 2017-02-16 VITALS — BP 155/78 | HR 69 | Temp 98.0°F | Resp 16 | Ht 60.0 in | Wt 145.8 lb

## 2017-02-16 DIAGNOSIS — Z1231 Encounter for screening mammogram for malignant neoplasm of breast: Secondary | ICD-10-CM | POA: Diagnosis not present

## 2017-02-16 DIAGNOSIS — R1084 Generalized abdominal pain: Secondary | ICD-10-CM | POA: Diagnosis not present

## 2017-02-16 DIAGNOSIS — K529 Noninfective gastroenteritis and colitis, unspecified: Secondary | ICD-10-CM

## 2017-02-16 DIAGNOSIS — M5441 Lumbago with sciatica, right side: Secondary | ICD-10-CM | POA: Diagnosis not present

## 2017-02-16 DIAGNOSIS — R197 Diarrhea, unspecified: Secondary | ICD-10-CM

## 2017-02-16 DIAGNOSIS — Z8719 Personal history of other diseases of the digestive system: Secondary | ICD-10-CM | POA: Insufficient documentation

## 2017-02-16 DIAGNOSIS — Z23 Encounter for immunization: Secondary | ICD-10-CM

## 2017-02-16 DIAGNOSIS — Z1239 Encounter for other screening for malignant neoplasm of breast: Secondary | ICD-10-CM

## 2017-02-16 MED ORDER — TRAMADOL HCL 50 MG PO TABS: 50.0000 mg | ORAL_TABLET | Freq: Three times a day (TID) | ORAL | 0 refills | Status: DC | PRN

## 2017-02-16 MED ORDER — CIPROFLOXACIN HCL 500 MG PO TABS: 500.0000 mg | ORAL_TABLET | Freq: Two times a day (BID) | ORAL | 1 refills | Status: AC

## 2017-02-16 NOTE — Progress Notes (Signed)
Lorraine Henderson 61 y.o.   Chief Complaint  Patient presents with  . Back Pain    down to RIGHT leg x 5 days  . Abdominal Pain    with nausea x 4-5 days    HISTORY OF PRESENT ILLNESS: This is a 61 y.o. female complaining of 4-5 day h/o nausea, non-bloody diarrhea, and generalized abdominal pain with foul smelling stools and increased gas along with pain to right lumbar area radiating down the right leg.  Back Pain  This is a new problem. The current episode started in the past 7 days. The problem occurs constantly. The problem has been waxing and waning since onset. The pain is present in the lumbar spine. The quality of the pain is described as aching. The pain radiates to the right knee, right thigh and right foot. The pain is at a severity of 6/10. The pain is moderate. The symptoms are aggravated by position and bending. Pertinent negatives include no abdominal pain, bladder incontinence, bowel incontinence, chest pain, dysuria, fever, headaches, numbness, paresis, paresthesias, pelvic pain, perianal numbness, tingling, weakness or weight loss. Risk factors: none.  Abdominal Pain  This is a new problem. The current episode started in the past 7 days. The onset quality is sudden. The problem occurs intermittently. The problem has been waxing and waning. The pain is located in the generalized abdominal region. The pain is at a severity of 5/10. The pain is moderate. The quality of the pain is colicky and cramping. The abdominal pain does not radiate. Associated symptoms include belching, diarrhea, flatus and nausea. Pertinent negatives include no dysuria, fever, headaches, hematochezia, hematuria, melena, vomiting or weight loss. Nothing aggravates the pain. The pain is relieved by nothing. She has tried nothing for the symptoms.     Prior to Admission medications   Medication Sig Start Date End Date Taking? Authorizing Provider  fexofenadine (ALLEGRA) 180 MG tablet Take 180 mg by mouth  daily.   Yes [provider]  atorvastatin (LIPITOR) 40 MG tablet Take 1 tablet (40 mg total) by mouth daily. 09/20/16 12/19/16  Georgina Quint, MD  EPINEPHrine 0.3 mg/0.3 mL IJ SOAJ injection Inject 0.3 mg into the muscle once.    [provider]  hydrochlorothiazide (HYDRODIURIL) 25 MG tablet Take 1 tablet (25 mg total) by mouth daily. 09/20/16 12/19/16  Georgina Quint, MD  mometasone (ELOCON) 0.1 % cream Apply 1 application topically daily. If needed for itchy spots 01/11/17   Kozlow, Alvira Philips, MD    Allergies  Allergen Reactions  . Aloe Vera Swelling  . Aspirin Swelling  . Shrimp [Shellfish Allergy] Swelling  . Benicar Hct [Olmesartan Medoxomil-Hctz] Swelling    There are no active problems to display for this patient.   Past Medical History:  Diagnosis Date  . Allergy   . Diverticulitis    colonoscopy 2 years ago: normal  . Hypertension   . Urticaria     Past Surgical History:  Procedure Laterality Date  . ABDOMINAL HYSTERECTOMY    . CESAREAN SECTION  1991 and 1996  . TONSILLECTOMY Bilateral     Social History   Social History  . Marital status: Married    Spouse name: N/A  . Number of children: N/A  . Years of education: N/A   Occupational History  . Not on file.   Social History Main Topics  . Smoking status: Never Smoker  . Smokeless tobacco: Never Used  . Alcohol use No  . Drug use: No  .  Sexual activity: Not on file   Other Topics Concern  . Not on file   Social History Narrative  . No narrative on file    Family History  Problem Relation Age of Onset  . Cancer Mother   . Diabetes Father   . Stroke Sister      Review of Systems  Constitutional: Negative.  Negative for chills, fever and weight loss.  HENT: Negative.   Eyes: Negative.   Respiratory: Negative.  Negative for cough and shortness of breath.   Cardiovascular: Negative.  Negative for chest pain and palpitations.  Gastrointestinal: Positive for  diarrhea, flatus and nausea. Negative for abdominal pain, bowel incontinence, hematochezia, melena and vomiting.  Genitourinary: Negative for bladder incontinence, dysuria, hematuria and pelvic pain.  Musculoskeletal: Positive for back pain.  Skin: Negative for rash.  Neurological: Negative.  Negative for dizziness, tingling, weakness, numbness, headaches and paresthesias.  Endo/Heme/Allergies: Negative.   All other systems reviewed and are negative.   Vitals:   02/16/17 0941  BP: (!) 155/78  Pulse: 69  Resp: 16  Temp: 98 F (36.7 C)  SpO2: 99%    Physical Exam  Constitutional: She is oriented to person, place, and time. She appears well-developed and well-nourished.  HENT:  Head: Normocephalic and atraumatic.  Right Ear: External ear normal.  Left Ear: External ear normal.  Mouth/Throat: Oropharynx is clear and moist.  Eyes: Pupils are equal, round, and reactive to light. Conjunctivae and EOM are normal.  Neck: Normal range of motion. Neck supple. No JVD present.  Cardiovascular: Normal rate, regular rhythm, normal heart sounds and intact distal pulses.   Pulmonary/Chest: Effort normal and breath sounds normal.  Abdominal: Soft. Bowel sounds are normal. She exhibits no distension and no mass. There is no tenderness. No hernia.  Musculoskeletal:       Lumbar back: She exhibits decreased range of motion, tenderness and spasm. She exhibits no bony tenderness, no swelling and normal pulse.  Lymphadenopathy:    She has no cervical adenopathy.  Neurological: She is alert and oriented to person, place, and time. No sensory deficit. She exhibits normal muscle tone.  Skin: Skin is warm and dry. Capillary refill takes less than 2 seconds. No rash noted.  Psychiatric: She has a normal mood and affect. Her behavior is normal.  Vitals reviewed.    ASSESSMENT & PLAN: Lorraine Henderson was seen today for back pain and abdominal pain.  Diagnoses and all orders for this visit:  Generalized  abdominal pain -     CBC with Differential/Platelet -     Comprehensive metabolic panel -     Hepatitis C antibody -     HIV antibody -     Lipid panel  Acute right-sided low back pain with right-sided sciatica -     traMADol (ULTRAM) 50 MG tablet; Take 1 tablet (50 mg total) by mouth every 8 (eight) hours as needed.  Flu vaccine need -     Flu Vaccine QUAD 36+ mos IM  Acute gastroenteritis -     ciprofloxacin (CIPRO) 500 MG tablet; Take 1 tablet (500 mg total) by mouth 2 (two) times daily.  Breast cancer screening -     MM DIGITAL SCREENING BILATERAL; Future  Diarrhea of presumed infectious origin  History of diverticulosis    Patient Instructions       IF you received an x-ray today, you will receive an invoice from [facility]. Please contact [facility] at [phone] with questions or concerns regarding your invoice.  IF you received labwork today, you will receive an invoice from [place]. Please contact [facility] at [phone] with questions or concerns regarding your invoice.   Our billing staff will not be able to assist you with questions regarding bills from these companies.  You will be contacted with the lab results as soon as they are available. The fastest way to get your results is to activate your My Chart account. Instructions are located on the last page of this paperwork. If you have not heard from [place] regarding the results in 2 weeks, please contact this office.     Food Choices to Help Relieve Diarrhea, Adult When you have diarrhea, the foods you eat and your eating habits are very important. Choosing the right foods and drinks can help:  Relieve diarrhea.  Replace lost fluids and nutrients.  Prevent dehydration.  What general guidelines should I follow? Relieving diarrhea  Choose foods with less than 2 g or .07 oz. of fiber per serving.  Limit fats to less than 8 tsp (38 g or 1.34 oz.) a day.  Avoid the  following: ? Foods and beverages sweetened with high-fructose corn syrup, honey, or sugar alcohols such as xylitol, sorbitol, and mannitol. ? Foods that contain a lot of fat or sugar. ? Fried, greasy, or spicy foods. ? High-fiber grains, breads, and cereals. ? Raw fruits and vegetables.  Eat foods that are rich in probiotics. These foods include dairy products such as yogurt and fermented milk products. They help increase healthy bacteria in the stomach and intestines (gastrointestinal tract, or GI tract).  If you have lactose intolerance, avoid dairy products. These may make your diarrhea worse.  Take medicine to help stop diarrhea (antidiarrheal medicine) only as told by your health care provider. Replacing nutrients  Eat small meals or snacks every 3-4 hours.  Eat bland foods, such as white rice, toast, or baked potato, until your diarrhea starts to get better. Gradually reintroduce nutrient-rich foods as tolerated or as told by your health care provider. This includes: ? Well-cooked protein foods. ? Peeled, seeded, and soft-cooked fruits and vegetables. ? Low-fat dairy products.  Take vitamin and mineral supplements as told by your health care provider. Preventing dehydration   Start by sipping water or a special solution to prevent dehydration (oral rehydration solution, ORS). Urine that is clear or pale yellow means that you are getting enough fluid.  Try to drink at least 8-10 cups of fluid each day to help replace lost fluids.  You may add other liquids in addition to water, such as clear juice or decaffeinated sports drinks, as tolerated or as told by your health care provider.  Avoid drinks with caffeine, such as coffee, tea, or soft drinks.  Avoid alcohol. What foods are recommended? The items listed may not be a complete list. Talk with your health care provider about what dietary choices are best for you. Grains White rice. White, [place], or pita breads (fresh or  toasted), including plain rolls, buns, or bagels. White pasta. Saltine, soda, or graham crackers. Pretzels. Low-fiber cereal. Cooked cereals made with water (such as cornmeal, farina, or cream cereals). Plain muffins. Matzo. Melba toast. Zwieback. Vegetables Potatoes (without the skin). Most well-cooked and canned vegetables without skins or seeds. Tender lettuce. Fruits Apple sauce. Fruits canned in juice. Cooked apricots, cherries, grapefruit, peaches, pears, or plums. Fresh bananas and cantaloupe. Meats and other protein foods Baked or boiled chicken. Eggs. Tofu. Fish. Seafood. Smooth nut butters. Ground or well-cooked tender beef, ham,  veal, lamb, pork, or poultry. Dairy Plain yogurt, kefir, and unsweetened liquid yogurt. Lactose-free milk, buttermilk, skim milk, or soy milk. Low-fat or nonfat hard cheese. Beverages Water. Low-calorie sports drinks. Fruit juices without pulp. Strained tomato and vegetable juices. Decaffeinated teas. Sugar-free beverages not sweetened with sugar alcohols. Oral rehydration solutions, if approved by your health care provider. Seasoning and other foods Bouillon, broth, or soups made from recommended foods. What foods are not recommended? The items listed may not be a complete list. Talk with your health care provider about what dietary choices are best for you. Grains Whole grain, whole wheat, bran, or rye breads, rolls, pastas, and crackers. Wild or brown rice. Whole grain or bran cereals. Barley. Oats and oatmeal. Corn tortillas or taco shells. Granola. Popcorn. Vegetables Raw vegetables. Fried vegetables. Cabbage, broccoli, Brussels sprouts, artichokes, baked beans, beet greens, corn, kale, legumes, peas, sweet potatoes, and yams. Potato skins. Cooked spinach and cabbage. Fruits Dried fruit, including raisins and dates. Raw fruits. Stewed or dried prunes. Canned fruits with syrup. Meat and other protein foods Fried or fatty meats. Deli meats. Chunky nut  butters. Nuts and seeds. Beans and lentils. Tomasa Blase. Hot dogs. Sausage. Dairy High-fat cheeses. Whole milk, chocolate milk, and beverages made with milk, such as milk shakes. Half-and-half. Cream. sour cream. Ice cream. Beverages Caffeinated beverages (such as coffee, tea, soda, or energy drinks). Alcoholic beverages. Fruit juices with pulp. Prune juice. Soft drinks sweetened with high-fructose corn syrup or sugar alcohols. High-calorie sports drinks. Fats and oils Butter. Cream sauces. Margarine. Salad oils. Plain salad dressings. Olives. Avocados. Mayonnaise. Sweets and desserts Sweet rolls, doughnuts, and sweet breads. Sugar-free desserts sweetened with sugar alcohols such as xylitol and sorbitol. Seasoning and other foods Honey. Hot sauce. Chili powder. Gravy. Cream-based or milk-based soups. Pancakes and waffles. Summary  When you have diarrhea, the foods you eat and your eating habits are very important.  Make sure you get at least 8-10 cups of fluid each day, or enough to keep your urine clear or pale yellow.  Eat bland foods and gradually reintroduce healthy, nutrient-rich foods as tolerated, or as told by your health care provider.  Avoid high-fiber, fried, greasy, or spicy foods. This information is not intended to replace advice given to you by your health care provider. Make sure you discuss any questions you have with your health care provider. Document Released: 09/04/2003 Document Revised: 06/11/2016 Document Reviewed: 06/11/2016 Elsevier Interactive Patient Education  2017 Elsevier Inc.  Opciones de alimentos para ayudar a Paramedic la diarrea - Adultos (Food Choices to Help Relieve Diarrhea, Adult) Cuando se tiene diarrea, los alimentos que se ingieren y los hbitos de alimentacin son Engineer, production. Elegir los [employer] y las bebidas adecuados ayuda a Actuary. Adems, debido a que la diarrea puede durar [employer], debe reponer la prdida de lquidos y  Customer service manager (como sodio, potasio y Editor, commissioning) a fin de ayudar a Statistician. QU PAUTAS GENERALES DEBO SEGUIR?  Beba lentamente 1 taza (8 onzas) de lquido por cada episodio de diarrea. Si bebe una cantidad de lquidos suficiente, la orina ser de tono claro o color amarillo plido.  Consuma alimentos con almidn. Algunas buenas opciones son arroz blanco, tostada blanca, pasta, cereales con bajo contenido de fibras, papas al horno (sin cscara), galletas saladas y panecillos.  Evite las porciones grandes de cualquier vegetal cocido.  Limite las frutas a dos porciones por da. Una porcin es  taza o un trozo pequeo.  Alimentos con [employer] de  2 g de fibra por porcin.  Limite las grasas a menos de 8 cucharaditas (38g) por Futures trader.  Evite las comidas fritas.  Consuma alimentos que contengan probiticos. Los probiticos se encuentran en ciertos productos lcteos.  Evite los alimentos y las bebidas que pueden aumentar la velocidad a la que el alimento se mueve a travs del estmago y de los intestinos (tracto gastrointestinal). Lo que debe evitar: ? Alimentos ricos en fibra, como frutas secas, frutas y vegetales crudos, frutos secos, semillas, alimentos con cereales integrales. ? Alimentos muy condimentados y con alto contenido de Neurosurgeon. ? Alimentos y bebidas endulzados con jarabe de maz de alto contenido de fructosa, miel o alcoholes de International aid/development worker, como xilitol, sorbitol y manitol.  QU ALIMENTOS SE RECOMIENDAN? Cereales Arroz blanco. Pan blanco, francs o pita (fresco o tostado), incluidos los [place], los bollos y las rosquillas. Pastas blancas. Galletas de [place], [place] o [place]. Pretzels. Cereales con bajo contenido de [employer] cocidos en agua (como harina de maz, smola o crema de cereales). Muffins. Pan cimo Tostada Melba. [place]. Vegetales Papas (sin cscara). Jugo de tomates o de vegetales Vegetales bien cocidos o enlatados sin semillas. Deatra James  tierna. Frutas Pur de [employer] cocido o enlatado, damascos, cerezas, cctel de frutas, pomelos, duraznos, peras o ciruelas. Bananas frescas, manzanas sin cscara, cerezas, uvas, meln, pomelo, duraznos, naranjas o ciruelas. Carnes y otros productos con protenas Pollo al horno o hervido. Huevos. Tofu. Pescado. Mariscos. Mantequilla de man, sin trozos. Carne molida o un bife tierno bien cocido, jamn, ternera, cordero, cerdo o aves. Lcteos Yogur natural, kefir y Dentist bebible sin Paediatric nurse. Leche sin Advice worker, suero de [place] o [place] de soja. Queso duro comn. Bebidas Bebidas deportivas. Caldos claros. Jugos de fruta diluidos (excepto de ciruelas). Gaseosas sin cafena comunes, como gaseosa de [place]. Agua. Ts descafeinados. Soluciones de rehidratacin oral. Bebidas sin azcar no endulzadas con alcoholes de azcar. Otros Consom, caldo o sopas hechas con los alimentos recomendados. Los artculos mencionados arriba pueden no ser [employer] de las bebidas o los alimentos recomendados. Comunquese con el nutricionista para conocer ms opciones. QU ALIMENTOS NO SE RECOMIENDAN? Cereales Cereales, galletas, pastas, panecillos y panes de cereales integrales, salvado o centeno. Arroz integral o arroz salvaje. Cereales con menos de 2 g de fibra por porcin. Tortillas de maz o tacos. Harina de avena cocida o seca. Granola. Palomitas de maz. Vegetales Vegetales crudos. Repollo, brcoli, repollitos de [place], alcachofas, porotos, hojas de remolacha, maz, col rizada, legumbres, guisantes y batatas. Cscara de papas. [place] y repollo cocidos. Nils Pyle Frutas secas, incluidas las ciruelas y los dtiles. Frutas crudas. Compota o ciruelas secas. Manzanas frescas con cscara, damascos, mangos, peras, frambuesas y frutillas. Carnes y otros productos con protenas Mantequilla de man espesa. Frutos secos y semillas. Porotos y lentejas. Panceta. Lcteos Quesos con alto contenido de [place]. Leche,  leche chocolatada y bebidas hechas con [place], como los batidos. Crema. Helados. Dulces y [employer], donas y pan dulce. Panqueques y waffles. Grasas y [employer] & [employer]. Salsas a base de crema. Margarina. Aceites para ensaladas. Condimentos para ensaladas. Aceitunas. [place]. Bebidas Bebidas con cafena (como caf, t, refrescos o bebidas energizantes). Bebidas alcohlicas. Jugos de frutas con pulpa. Jugo de ciruelas. Bebidas endulzadas con jarabe de maz de alto contenido de fructosa o alcoholes de International aid/development worker. Otros Coco. Salsa picante. [place] en polvo. [place]. Salsas. Sopas a base de crema o de [place]. Los artculos mencionados arriba pueden no ser [employer] de las bebidas y los alimentos que se Theatre stage manager.  Comunquese con el nutricionista para recibir ms informacin. QU DEBO HACER SI ME DESHIDRATO? Algunas veces, la diarrea puede producir deshidratacin. Entre los signos de deshidratacin se incluyen la orina oscura y la boca y la piel secas. Si piensa que est deshidratado, debe rehidratarse con una solucin de rehidratacin oral. Estas soluciones se pueden comprar en las farmacias, en las tiendas minoristas o por Internet. Beba  o 1 taza (120-275ml) de solucin de rehidratacin oral cada vez que tenga un episodio de diarrea. Si beber esta cantidad empeora la diarrea, intente beber en cantidades ms pequeas con ms frecuencia. Por ejemplo, tomar 1-3 cucharaditas (5-22ml) cada 5-31minutos. Una regla general para mantenerse hidratado es beber 1  -2 litros de lquido Air cabin crew. Hable con el mdico sobre la cantidad especfica que usted debe beber diariamente. Beba suficiente lquido para Photographer orina clara o de color amarillo plido. Esta informacin no tiene Theme park manager el consejo del mdico. Asegrese de hacerle al mdico cualquier pregunta que tenga. Document Released: 06/14/2005 Document Revised: 07/05/2014 Document Reviewed: 05/07/2013 Elsevier  Interactive Patient Education  2017 Elsevier Inc.      Edwina Barth, MD [facility]

## 2017-02-16 NOTE — Patient Instructions (Addendum)
IF you received an x-ray today, you will receive an invoice from [facility]. Please contact [facility] at [phone] with questions or concerns regarding your invoice.   IF you received labwork today, you will receive an invoice from [facility]. Please contact [facility] at [phone] with questions or concerns regarding your invoice.   Our billing staff will not be able to assist you with questions regarding bills from these companies.  You will be contacted with the lab results as soon as they are available. The fastest way to get your results is to activate your My Chart account. Instructions are located on the last page of this paperwork. If you have not heard from [place] regarding the results in 2 weeks, please contact this office.     Food Choices to Help Relieve Diarrhea, Adult When you have diarrhea, the foods you eat and your eating habits are very important. Choosing the right foods and drinks can help:  Relieve diarrhea.  Replace lost fluids and nutrients.  Prevent dehydration.  What general guidelines should I follow? Relieving diarrhea  Choose foods with less than 2 g or .07 oz. of fiber per serving.  Limit fats to less than 8 tsp (38 g or 1.34 oz.) a day.  Avoid the following: ? Foods and beverages sweetened with high-fructose corn syrup, honey, or sugar alcohols such as xylitol, sorbitol, and mannitol. ? Foods that contain a lot of fat or sugar. ? Fried, greasy, or spicy foods. ? High-fiber grains, breads, and cereals. ? Raw fruits and vegetables.  Eat foods that are rich in probiotics. These foods include dairy products such as yogurt and fermented milk products. They help increase healthy bacteria in the stomach and intestines (gastrointestinal tract, or GI tract).  If you have lactose intolerance, avoid dairy products. These may make your diarrhea worse.  Take medicine to help stop diarrhea (antidiarrheal medicine) only as told by your  health care provider. Replacing nutrients  Eat small meals or snacks every 3-4 hours.  Eat bland foods, such as white rice, toast, or baked potato, until your diarrhea starts to get better. Gradually reintroduce nutrient-rich foods as tolerated or as told by your health care provider. This includes: ? Well-cooked protein foods. ? Peeled, seeded, and soft-cooked fruits and vegetables. ? Low-fat dairy products.  Take vitamin and mineral supplements as told by your health care provider. Preventing dehydration   Start by sipping water or a special solution to prevent dehydration (oral rehydration solution, ORS). Urine that is clear or pale yellow means that you are getting enough fluid.  Try to drink at least 8-10 cups of fluid each day to help replace lost fluids.  You may add other liquids in addition to water, such as clear juice or decaffeinated sports drinks, as tolerated or as told by your health care provider.  Avoid drinks with caffeine, such as coffee, tea, or soft drinks.  Avoid alcohol. What foods are recommended? The items listed may not be a complete list. Talk with your health care provider about what dietary choices are best for you. Grains White rice. White, [place], or pita breads (fresh or toasted), including plain rolls, buns, or bagels. White pasta. Saltine, soda, or graham crackers. Pretzels. Low-fiber cereal. Cooked cereals made with water (such as cornmeal, farina, or cream cereals). Plain muffins. Matzo. Melba toast. Zwieback. Vegetables Potatoes (without the skin). Most well-cooked and canned vegetables without skins or seeds. Tender lettuce. Fruits Apple sauce. Fruits canned in juice. Cooked apricots, cherries,  grapefruit, peaches, pears, or plums. Fresh bananas and cantaloupe. Meats and other protein foods Baked or boiled chicken. Eggs. Tofu. Fish. Seafood. Smooth nut butters. Ground or well-cooked tender beef, ham, veal, lamb, pork, or poultry. Dairy Plain  yogurt, kefir, and unsweetened liquid yogurt. Lactose-free milk, buttermilk, skim milk, or soy milk. Low-fat or nonfat hard cheese. Beverages Water. Low-calorie sports drinks. Fruit juices without pulp. Strained tomato and vegetable juices. Decaffeinated teas. Sugar-free beverages not sweetened with sugar alcohols. Oral rehydration solutions, if approved by your health care provider. Seasoning and other foods Bouillon, broth, or soups made from recommended foods. What foods are not recommended? The items listed may not be a complete list. Talk with your health care provider about what dietary choices are best for you. Grains Whole grain, whole wheat, bran, or rye breads, rolls, pastas, and crackers. Wild or brown rice. Whole grain or bran cereals. Barley. Oats and oatmeal. Corn tortillas or taco shells. Granola. Popcorn. Vegetables Raw vegetables. Fried vegetables. Cabbage, broccoli, Brussels sprouts, artichokes, baked beans, beet greens, corn, kale, legumes, peas, sweet potatoes, and yams. Potato skins. Cooked spinach and cabbage. Fruits Dried fruit, including raisins and dates. Raw fruits. Stewed or dried prunes. Canned fruits with syrup. Meat and other protein foods Fried or fatty meats. Deli meats. Chunky nut butters. Nuts and seeds. Beans and lentils. Tomasa Blase. Hot dogs. Sausage. Dairy High-fat cheeses. Whole milk, chocolate milk, and beverages made with milk, such as milk shakes. Half-and-half. Cream. sour cream. Ice cream. Beverages Caffeinated beverages (such as coffee, tea, soda, or energy drinks). Alcoholic beverages. Fruit juices with pulp. Prune juice. Soft drinks sweetened with high-fructose corn syrup or sugar alcohols. High-calorie sports drinks. Fats and oils Butter. Cream sauces. Margarine. Salad oils. Plain salad dressings. Olives. Avocados. Mayonnaise. Sweets and desserts Sweet rolls, doughnuts, and sweet breads. Sugar-free desserts sweetened with sugar alcohols such as xylitol  and sorbitol. Seasoning and other foods Honey. Hot sauce. Chili powder. Gravy. Cream-based or milk-based soups. Pancakes and waffles. Summary  When you have diarrhea, the foods you eat and your eating habits are very important.  Make sure you get at least 8-10 cups of fluid each day, or enough to keep your urine clear or pale yellow.  Eat bland foods and gradually reintroduce healthy, nutrient-rich foods as tolerated, or as told by your health care provider.  Avoid high-fiber, fried, greasy, or spicy foods. This information is not intended to replace advice given to you by your health care provider. Make sure you discuss any questions you have with your health care provider. Document Released: 09/04/2003 Document Revised: 06/11/2016 Document Reviewed: 06/11/2016 Elsevier Interactive Patient Education  2017 Elsevier Inc.  Opciones de alimentos para ayudar a Paramedic la diarrea - Adultos (Food Choices to Help Relieve Diarrhea, Adult) Cuando se tiene diarrea, los alimentos que se ingieren y los hbitos de alimentacin son Engineer, production. Elegir los [employer] y las bebidas adecuados ayuda a Actuary. Adems, debido a que la diarrea puede durar [employer], debe reponer la prdida de lquidos y Customer service manager (como sodio, potasio y Editor, commissioning) a fin de ayudar a Statistician. QU PAUTAS GENERALES DEBO SEGUIR?  Beba lentamente 1 taza (8 onzas) de lquido por cada episodio de diarrea. Si bebe una cantidad de lquidos suficiente, la orina ser de tono claro o color amarillo plido.  Consuma alimentos con almidn. Algunas buenas opciones son arroz blanco, tostada blanca, pasta, cereales con bajo contenido de fibras, papas al horno (sin cscara), galletas saladas y panecillos.  Evite las porciones grandes de cualquier vegetal cocido.  Limite las frutas a dos porciones por da. Una porcin es  taza o un trozo pequeo.  Alimentos con menos de 2 g de fibra por porcin.  Limite  las grasas a menos de 8 cucharaditas (38g) por Futures trader.  Evite las comidas fritas.  Consuma alimentos que contengan probiticos. Los probiticos se encuentran en ciertos productos lcteos.  Evite los alimentos y las bebidas que pueden aumentar la velocidad a la que el alimento se mueve a travs del estmago y de los intestinos (tracto gastrointestinal). Lo que debe evitar: ? Alimentos ricos en fibra, como frutas secas, frutas y vegetales crudos, frutos secos, semillas, alimentos con cereales integrales. ? Alimentos muy condimentados y con alto contenido de Neurosurgeon. ? Alimentos y bebidas endulzados con jarabe de maz de alto contenido de fructosa, miel o alcoholes de International aid/development worker, como xilitol, sorbitol y manitol.  QU ALIMENTOS SE RECOMIENDAN? Cereales Arroz blanco. Pan blanco, francs o pita (fresco o tostado), incluidos los [place], los bollos y las rosquillas. Pastas blancas. Galletas de [place], [place] o [place]. Pretzels. Cereales con bajo contenido de Sara Lee cocidos en agua (como harina de maz, smola o crema de cereales). Muffins. Pan cimo Tostada Melba. [place]. Vegetales Papas (sin cscara). Jugo de tomates o de vegetales Vegetales bien cocidos o enlatados sin semillas. Deatra James tierna. Frutas Pur de Fisher [employer] cocido o enlatado, damascos, cerezas, cctel de frutas, pomelos, duraznos, peras o ciruelas. Bananas frescas, manzanas sin cscara, cerezas, uvas, meln, pomelo, duraznos, naranjas o ciruelas. Carnes y otros productos con protenas Pollo al horno o hervido. Huevos. Tofu. Pescado. Mariscos. Mantequilla de man, sin trozos. Carne molida o un bife tierno bien cocido, jamn, ternera, cordero, cerdo o aves. Lcteos Yogur natural, kefir y Dentist bebible sin Paediatric nurse. Leche sin Advice worker, suero de [place] o [place] de soja. Queso duro comn. Bebidas Bebidas deportivas. Caldos claros. Jugos de fruta diluidos (excepto de ciruelas). Gaseosas sin cafena comunes, como gaseosa de [place]. Agua.  Ts descafeinados. Soluciones de rehidratacin oral. Bebidas sin azcar no endulzadas con alcoholes de azcar. Otros Consom, caldo o sopas hechas con los alimentos recomendados. Los artculos mencionados arriba pueden no ser [employer]on de las bebidas o los alimentos recomendados. Comunquese con el nutricionista para conocer ms opciones. QU ALIMENTOS NO SE RECOMIENDAN? Cereales Cereales, galletas, pastas, panecillos y panes de cereales integrales, salvado o centeno. Arroz integral o arroz salvaje. Cereales con menos de 2 g de fibra por porcin. Tortillas de maz o tacos. Harina de avena cocida o seca. Granola. Palomitas de maz. Vegetales Vegetales crudos. Repollo, brcoli, repollitos de [place]us[place], alcachofas, porotos, hojas de remolacha, maz, col rizada, legumbres, guisantes y batatas. Cscara de papas. [place] y repollo cocidos. Nils Pyle Frutas secas, incluidas las ciruelas y los dtiles. Frutas crudas. Compota o ciruelas secas. Manzanas frescas con cscara, damascos, mangos, peras, frambuesas y frutillas. Carnes y otros productos con protenas Mantequilla de man espesa. Frutos secos y semillas. Porotos y lentejas. Panceta. Lcteos Quesos con alto contenido de [place]. Leche, leche chocolatada y bebidas hechas con [place], como los batidos. Crema. Helados. Dulces y [employer], donas y pan dulce. Panqueques y waffles. Grasas y Barnes & Noble. Salsas a base de crema. Margarina. Aceites para ensaladas. Condimentos para ensaladas. Aceitunas. [place]. Bebidas Bebidas con cafena (como caf, t, refrescos o bebidas energizantes). Bebidas alcohlicas. Jugos de frutas con pulpa. Jugo de ciruelas. Bebidas endulzadas con jarabe de maz de alto contenido de fructosa o alcoholes de International aid/development worker. Otros Coco. Salsa picante. [place] en  polvo. [place]. Salsas. Sopas a base de crema o de [place]. Los artculos mencionados arriba pueden no ser Ray[employer]on de las bebidas y los  alimentos que se Theatre stage manager. Comunquese con el nutricionista para recibir ms informacin. QU DEBO HACER SI ME DESHIDRATO? Algunas veces, la diarrea puede producir deshidratacin. Entre los signos de deshidratacin se incluyen la orina oscura y la boca y la piel secas. Si piensa que est deshidratado, debe rehidratarse con una solucin de rehidratacin oral. Estas soluciones se pueden comprar en las farmacias, en las tiendas minoristas o por Internet. Beba  o 1 taza (120-273ml) de solucin de rehidratacin oral cada vez que tenga un episodio de diarrea. Si beber esta cantidad empeora la diarrea, intente beber en cantidades ms pequeas con ms frecuencia. Por ejemplo, tomar 1-3 cucharaditas (5-73ml) cada 5-58minutos. Una regla general para mantenerse hidratado es beber 1  -2 litros de lquido Air cabin crew. Hable con el mdico sobre la cantidad especfica que usted debe beber diariamente. Beba suficiente lquido para Photographer orina clara o de color amarillo plido. Esta informacin no tiene Theme park manager el consejo del mdico. Asegrese de hacerle al mdico cualquier pregunta que tenga. Document Released: 06/14/2005 Document Revised: 07/05/2014 Document Reviewed: 05/07/2013 Elsevier Interactive Patient Education  2017 [employer].

## 2017-02-17 ENCOUNTER — Encounter: Payer: Self-pay | Admitting: *Deleted

## 2017-02-17 LAB — COMPREHENSIVE METABOLIC PANEL
A/G RATIO: 1.4 (ref 1.2–2.2)
ALK PHOS: 164 IU/L — AB (ref 39–117)
ALT: 15 IU/L (ref 0–32)
AST: 18 IU/L (ref 0–40)
Albumin: 4.3 g/dL (ref 3.6–4.8)
BILIRUBIN TOTAL: 0.5 mg/dL (ref 0.0–1.2)
BUN/Creatinine Ratio: 14 (ref 12–28)
BUN: 11 mg/dL (ref 8–27)
CHLORIDE: 99 mmol/L (ref 96–106)
CO2: 25 mmol/L (ref 20–29)
Calcium: 9.7 mg/dL (ref 8.7–10.3)
Creatinine, Ser: 0.78 mg/dL (ref 0.57–1.00)
GFR calc Af Amer: 94 mL/min/{1.73_m2} (ref >59)
GFR calc non Af Amer: 82 mL/min/{1.73_m2} (ref >59)
GLOBULIN, TOTAL: 3.1 g/dL (ref 1.5–4.5)
Glucose: 86 mg/dL (ref 65–99)
POTASSIUM: 3.8 mmol/L (ref 3.5–5.2)
SODIUM: 142 mmol/L (ref 134–144)
Total Protein: 7.4 g/dL (ref 6.0–8.5)

## 2017-02-17 LAB — LIPID PANEL
Chol/HDL Ratio: 3.4 ratio (ref 0.0–4.4)
Cholesterol, Total: 153 mg/dL (ref 100–199)
HDL: 45 mg/dL (ref >39)
LDL Calculated: 66 mg/dL (ref 0–99)
Triglycerides: 211 mg/dL — ABNORMAL HIGH (ref 0–149)
VLDL CHOLESTEROL CAL: 42 mg/dL — AB (ref 5–40)

## 2017-02-17 LAB — CBC WITH DIFFERENTIAL/PLATELET
BASOS: 1 %
Basophils Absolute: 0.1 10*3/uL (ref 0.0–0.2)
EOS (ABSOLUTE): 0.3 10*3/uL (ref 0.0–0.4)
EOS: 5 %
HEMATOCRIT: 42.1 % (ref 34.0–46.6)
Hemoglobin: 13.8 g/dL (ref 11.1–15.9)
Immature Grans (Abs): 0 10*3/uL (ref 0.0–0.1)
Immature Granulocytes: 0 %
LYMPHS ABS: 2.9 10*3/uL (ref 0.7–3.1)
Lymphs: 45 %
MCH: 30.4 pg (ref 26.6–33.0)
MCHC: 32.8 g/dL (ref 31.5–35.7)
MCV: 93 fL (ref 79–97)
MONOS ABS: 0.5 10*3/uL (ref 0.1–0.9)
Monocytes: 7 %
Neutrophils Absolute: 2.7 10*3/uL (ref 1.4–7.0)
Neutrophils: 42 %
PLATELETS: 343 10*3/uL (ref 150–379)
RBC: 4.54 x10E6/uL (ref 3.77–5.28)
RDW: 13.3 % (ref 12.3–15.4)
WBC: 6.5 10*3/uL (ref 3.4–10.8)

## 2017-02-17 LAB — HEPATITIS C ANTIBODY

## 2017-02-17 LAB — HIV ANTIBODY (ROUTINE TESTING W REFLEX): HIV SCREEN 4TH GENERATION: NONREACTIVE

## 2017-02-18 ENCOUNTER — Ambulatory Visit (INDEPENDENT_AMBULATORY_CARE_PROVIDER_SITE_OTHER)

## 2017-02-18 DIAGNOSIS — J309 Allergic rhinitis, unspecified: Secondary | ICD-10-CM

## 2017-02-22 ENCOUNTER — Ambulatory Visit (INDEPENDENT_AMBULATORY_CARE_PROVIDER_SITE_OTHER): Admitting: *Deleted

## 2017-02-22 DIAGNOSIS — J309 Allergic rhinitis, unspecified: Secondary | ICD-10-CM | POA: Diagnosis not present

## 2017-02-23 ENCOUNTER — Ambulatory Visit (INDEPENDENT_AMBULATORY_CARE_PROVIDER_SITE_OTHER): Payer: TRICARE For Life (TFL) | Admitting: Emergency Medicine

## 2017-02-23 ENCOUNTER — Encounter: Payer: Self-pay | Admitting: Emergency Medicine

## 2017-02-23 VITALS — BP 130/66 | HR 71 | Temp 98.0°F | Resp 16 | Ht 60.0 in | Wt 144.6 lb

## 2017-02-23 DIAGNOSIS — Z1231 Encounter for screening mammogram for malignant neoplasm of breast: Secondary | ICD-10-CM

## 2017-02-23 DIAGNOSIS — R1084 Generalized abdominal pain: Secondary | ICD-10-CM | POA: Diagnosis not present

## 2017-02-23 MED ORDER — HYOSCYAMINE SULFATE SL 0.125 MG SL SUBL: 1.0000 | SUBLINGUAL_TABLET | Freq: Three times a day (TID) | SUBLINGUAL | 1 refills | Status: AC | PRN

## 2017-02-23 NOTE — Patient Instructions (Addendum)
     IF you received an x-ray today, you will receive an invoice from [facility]. Please contact [facility] at [phone] with questions or concerns regarding your invoice.   IF you received labwork today, you will receive an invoice from [facility]. Please contact [facility] at [phone] with questions or concerns regarding your invoice.   Our billing staff will not be able to assist you with questions regarding bills from these companies.  You will be contacted with the lab results as soon as they are available. The fastest way to get your results is to activate your My Chart account. Instructions are located on the last page of this paperwork. If you have not heard from us regarding the results in 2 weeks, please contact this office.    We recommend that you schedule a mammogram for breast cancer screening. Typically, you do not need a referral to do this. Please contact a local imaging center to schedule your mammogram.  [facility] - [phone]) [phone]  *ask for the Radiology Department [facility] - [phone] or [phone]) [phone]  [facility] - [phone] [facility] - [phone] [facility] - [phone]) [phone]  *ask for the Radiology Department [facility] - [phone]) [phone]  *ask for the Radiology Department [facility] - [phone]) [phone]  *ask for the Mammography Department [facility] - [phone] Dolor abdominal en adultos (Abdominal Pain, Adult) El dolor de [place] (abdominal) puede tener muchas causas. La mayora de las veces, el dolor de [place] no es peligroso. Muchos de Franklin Resourcesestos casos de dolor de estmago pueden controlarse y tratarse en casa. CUIDADOS EN EL HOGAR  No tome medicamentos que lo ayuden a defecar (laxantes), salvo que su mdico se lo indique.  Solo tome los medicamentos que le haya indicado su mdico.  Coma o beba lo que le indique su  mdico. Su mdico le dir si debe seguir una dieta especial.  SOLICITE AYUDA SI:  No sabe cul es la causa del dolor de [place].  Tiene dolor de estmago cuando siente ganas de vomitar (nuseas) o tiene colitis (diarrea).  Tiene dolor durante la miccin o la evacuacin.  El dolor de estmago lo despierta de noche.  Tiene dolor de Mirantestmago que empeora o Raritanmejora cuando come.  Tiene dolor de Mirantestmago que empeora cuando come CIGNAalimentos grasosos.  Tiene fiebre.  SOLICITE AYUDA DE INMEDIATO SI:  El dolor no desaparece en un plazo mximo de 2horas.  No deja de (vomitar).  El dolor cambia y se Librarian, academiclocaliza solo en la parte derecha o izquierda del [place].  La materia fecal es sanguinolenta o de aspecto alquitranado.  ASEGRESE DE QUE:  Comprende estas instrucciones.  Controlar su afeccin.  Recibir ayuda de inmediato si no mejora o si empeora.  Esta informacin no tiene Theme park managercomo fin reemplazar el consejo del mdico. Asegrese de hacerle al mdico cualquier pregunta que tenga. Document Released: 09/10/2008 Document Revised: 07/05/2014 Document Reviewed: 11/26/2015 Elsevier Interactive Patient Education  2017 [employer]vier Inc.

## 2017-02-23 NOTE — Progress Notes (Signed)
Lorraine Henderson 61 y.o.   Chief Complaint  Patient presents with  . Follow-up    abominal pain    HISTORY OF PRESENT ILLNESS: This is a 61 y.o. female here for follow up of abdominal pain; still having occasional cramps relieved with BM; no fever, n/v, or rectal bleeding.   HPI   Prior to Admission medications   Medication Sig Start Date End Date Taking? Authorizing Provider  EPINEPHrine 0.3 mg/0.3 mL IJ SOAJ injection Inject 0.3 mg into the muscle once.   Yes [provider]  fexofenadine (ALLEGRA) 180 MG tablet Take 180 mg by mouth daily.   Yes [provider]  traMADol (ULTRAM) 50 MG tablet Take 1 tablet (50 mg total) by mouth every 8 (eight) hours as needed. 02/16/17  Yes Keni Wafer, Eilleen Kempf, MD  atorvastatin (LIPITOR) 40 MG tablet Take 1 tablet (40 mg total) by mouth daily. 09/20/16 12/19/16  Georgina Quint, MD  hydrochlorothiazide (HYDRODIURIL) 25 MG tablet Take 1 tablet (25 mg total) by mouth daily. 09/20/16 12/19/16  Georgina Quint, MD  mometasone (ELOCON) 0.1 % cream Apply 1 application topically daily. If needed for itchy spots Patient not taking: Reported on 02/23/2017 01/11/17   Jessica Priest, MD    Allergies  Allergen Reactions  . Aloe Vera Swelling  . Aspirin Swelling  . Shrimp [Shellfish Allergy] Swelling  . Benicar Hct [Olmesartan Medoxomil-Hctz] Swelling    Patient Active Problem List   Diagnosis Date Noted  . Acute right-sided low back pain with right-sided sciatica 02/16/2017  . Generalized abdominal pain 02/16/2017  . Flu vaccine need 02/16/2017  . Acute gastroenteritis 02/16/2017  . Breast cancer screening 02/16/2017  . Diarrhea of presumed infectious origin 02/16/2017  . History of diverticulosis 02/16/2017    Past Medical History:  Diagnosis Date  . Allergy   . Diverticulitis    colonoscopy 2 years ago: normal  . Hypertension   . Urticaria     Past Surgical History:  Procedure Laterality Date  . ABDOMINAL  HYSTERECTOMY    . CESAREAN SECTION  1991 and 1996  . TONSILLECTOMY Bilateral     Social History   Social History  . Marital status: Married    Spouse name: N/A  . Number of children: N/A  . Years of education: N/A   Occupational History  . Not on file.   Social History Main Topics  . Smoking status: Never Smoker  . Smokeless tobacco: Never Used  . Alcohol use No  . Drug use: No  . Sexual activity: Not on file   Other Topics Concern  . Not on file   Social History Narrative  . No narrative on file    Family History  Problem Relation Age of Onset  . Cancer Mother   . Diabetes Father   . Stroke Sister      Review of Systems  Constitutional: Negative.  Negative for chills and fever.  HENT: Negative.   Eyes: Negative.   Respiratory: Negative.  Negative for cough.   Cardiovascular: Negative.  Negative for chest pain and leg swelling.  Gastrointestinal: Positive for abdominal pain. Negative for blood in stool, diarrhea, melena, nausea and vomiting.  Genitourinary: Negative for dysuria, flank pain and hematuria.  Musculoskeletal: Positive for back pain.       Right leg pain  Skin: Negative.  Negative for rash.  Neurological: Negative.  Negative for dizziness and headaches.  Endo/Heme/Allergies: Negative.   All other systems reviewed and are negative.   Vitals:  02/23/17 0816 02/23/17 0824  BP: (!) 151/78 130/66  Pulse: 71   Resp: 16   Temp: 98 F (36.7 C)   SpO2: 96%     Physical Exam  Constitutional: She is oriented to person, place, and time. She appears well-developed and well-nourished.  HENT:  Head: Normocephalic and atraumatic.  Mouth/Throat: Oropharynx is clear and moist.  Eyes: Pupils are equal, round, and reactive to light. Conjunctivae and EOM are normal.  Neck: Normal range of motion. Neck supple. No JVD present. No thyromegaly present.  Cardiovascular: Normal rate, regular rhythm and normal heart sounds.   Pulmonary/Chest: Effort normal and  breath sounds normal.  Abdominal: Soft. Bowel sounds are normal. She exhibits no distension. There is no tenderness.  Musculoskeletal: Normal range of motion.  Lymphadenopathy:    She has no cervical adenopathy.  Neurological: She is alert and oriented to person, place, and time. No sensory deficit. She exhibits normal muscle tone.  Skin: Skin is warm and dry. Capillary refill takes less than 2 seconds. No rash noted.  Psychiatric: She has a normal mood and affect. Her behavior is normal.  Vitals reviewed.    ASSESSMENT & PLAN: Lorraine Henderson was seen today for follow-up.  Diagnoses and all orders for this visit:  Generalized abdominal pain -     H. pylori breath test -     Ambulatory referral to Gastroenterology -     US Abdomen Complete; Future -     Hyoscyamine Sulfate SL (LEVSIN/SL) 0.125 MG SUBL; Place 1 tablet under the tongue 3 (three) times daily as needed.  Other orders -     MM Digital Diagnostic Bilat; Future     Patient Instructions       IF you received an x-ray today, you will receive an invoice from [facility]. Please contact [facility] at [phone] with questions or concerns regarding your invoice.   IF you received labwork today, you will receive an invoice from [facility]. Please contact [facility] at [phone] with questions or concerns regarding your invoice.   Our billing staff will not be able to assist you with questions regarding bills from these companies.  You will be contacted with the lab results as soon as they are available. The fastest way to get your results is to activate your My Chart account. Instructions are located on the last page of this paperwork. If you have not heard from [place] regarding the results in 2 weeks, please contact this office.    We recommend that you schedule a mammogram for breast cancer screening. Typically, you do not need a referral to do this. Please contact a local imaging center to schedule your  mammogram.  [facility] - [phone]  *ask for the Radiology Department [facility]) - [phone] or [phone]  [facility] - [phone] [facility] - [phone] [facility] - [phone]  *ask for the Radiology Department [facility] - [phone]  *ask for the Radiology Department [facility] - [phone]  *ask for the Mammography Department [facility] - [phone] Dolor abdominal en adultos (Abdominal Pain, Adult) El dolor de [place] (abdominal) puede tener muchas causas. La mayora de las veces, el dolor de [place] no es peligroso. Muchos de [employer] de dolor de estmago pueden controlarse y tratarse en casa. CUIDADOS EN EL HOGAR  No tome medicamentos que lo ayuden a defecar (laxantes), salvo que su mdico se lo indique.  Solo  tome los medicamentos que le haya indicado su mdico.  Coma o beba lo que le indique su mdico. Su mdico le dir si debe seguir una dieta especial.  SOLICITE AYUDA SI:  No sabe cul es la causa del dolor de [place].  Tiene dolor de estmago cuando siente ganas de vomitar (nuseas) o tiene colitis (diarrea).  Tiene dolor durante la miccin o la evacuacin.  El dolor de estmago lo despierta de noche.  Tiene dolor de Mirantestmago que empeora o Morgan[place]ora cuando come.  Tiene dolor de Mirantestmago que empeora cuando come CIGNAalimentos grasosos.  Tiene fiebre.  SOLICITE AYUDA DE INMEDIATO SI:  El dolor no desaparece en un plazo mximo de 2horas.  No deja de (vomitar).  El dolor cambia y se Librarian, academiclocaliza solo en la parte derecha o izquierda del [place]ago.  La materia fecal es sanguinolenta o de aspecto alquitranado.  ASEGRESE DE QUE:  Comprende estas instrucciones.  Controlar su afeccin.  Recibir ayuda de inmediato si no mejora o si empeora.  Esta informacin no tiene Theme park managercomo fin reemplazar el consejo del mdico. Asegrese de  hacerle al mdico cualquier pregunta que tenga. Document Released: 09/10/2008 Document Revised: 07/05/2014 Document Reviewed: 11/26/2015 Elsevier Interactive Patient Education  2017 Elsevier Inc.      Edwina BarthMiguel Kristy Catoe, MD [facility]

## 2017-02-24 ENCOUNTER — Encounter: Payer: Self-pay | Admitting: Radiology

## 2017-02-24 LAB — H. PYLORI BREATH TEST: H pylori Breath Test: NEGATIVE

## 2017-02-25 ENCOUNTER — Ambulatory Visit (INDEPENDENT_AMBULATORY_CARE_PROVIDER_SITE_OTHER): Payer: TRICARE For Life (TFL)

## 2017-02-25 DIAGNOSIS — J309 Allergic rhinitis, unspecified: Secondary | ICD-10-CM | POA: Diagnosis not present

## 2017-03-01 ENCOUNTER — Ambulatory Visit (INDEPENDENT_AMBULATORY_CARE_PROVIDER_SITE_OTHER): Payer: TRICARE For Life (TFL)

## 2017-03-01 DIAGNOSIS — J309 Allergic rhinitis, unspecified: Secondary | ICD-10-CM

## 2017-03-04 ENCOUNTER — Telehealth: Payer: Self-pay

## 2017-03-04 ENCOUNTER — Telehealth: Payer: Self-pay | Admitting: Emergency Medicine

## 2017-03-04 ENCOUNTER — Ambulatory Visit (INDEPENDENT_AMBULATORY_CARE_PROVIDER_SITE_OTHER): Payer: TRICARE For Life (TFL)

## 2017-03-04 DIAGNOSIS — J309 Allergic rhinitis, unspecified: Secondary | ICD-10-CM

## 2017-03-04 DIAGNOSIS — Z1239 Encounter for other screening for malignant neoplasm of breast: Secondary | ICD-10-CM

## 2017-03-04 NOTE — Telephone Encounter (Signed)
[facility] CenterFYI Hey Lorraine Henderson, I just got off the phone with [employer]. They said Lorraine Henderson has [employer] since 2012. They set up an authorization for her for 07-21-16 to 07-21-17. It will take 24 hours to 3 days for it to take effect. At that point, I can call back & asked them to reprocess all claims. It's auth # [id number]. So I will do that next week. I will call her husband today and let him know. So can you set her up with Lorraine Henderson Prime, ID# [id number] and the authorization and I will go back & file the recent injections that were not filed. Thank you so much, Olegario MessierKathy

## 2017-03-04 NOTE — Telephone Encounter (Signed)
Spoke with Lorraine Henderson , advised him that [employer] gave us an authorization & I will call them back next week to reprocess claims - kt

## 2017-03-04 NOTE — Telephone Encounter (Signed)
Please place referral

## 2017-03-04 NOTE — Telephone Encounter (Signed)
Miguel - Pt needs referral for mammogram.  Says insurance requires referral for it now. [phone]

## 2017-03-04 NOTE — Telephone Encounter (Signed)
Referral placed  Thanks!

## 2017-03-04 NOTE — Telephone Encounter (Signed)
Called and left a voicemail for the Referral coordinator at Dr. Darlin DropSagardias office. Patient came in with her husband concerned about their bill. They stated that [employer] is requesting that we have a referral for the patients Office visits and also injections. When I last called the PCP back on 01/10/17, I was told the patient did not need a authorization. Olegario MessierKathy in our billing department has been going back and forth talking to [employer] about this issue. Olegario MessierKathy will be giving [employer] another call between today and next week. I let the patient and her husband know this. They understood.   [address]Miguel Espinel (Spouse)  [phone]

## 2017-03-08 ENCOUNTER — Ambulatory Visit (INDEPENDENT_AMBULATORY_CARE_PROVIDER_SITE_OTHER): Payer: TRICARE For Life (TFL) | Admitting: *Deleted

## 2017-03-08 DIAGNOSIS — J309 Allergic rhinitis, unspecified: Secondary | ICD-10-CM | POA: Diagnosis not present

## 2017-03-09 ENCOUNTER — Encounter: Payer: Self-pay | Admitting: Gastroenterology

## 2017-03-10 ENCOUNTER — Ambulatory Visit (INDEPENDENT_AMBULATORY_CARE_PROVIDER_SITE_OTHER): Payer: TRICARE For Life (TFL) | Admitting: *Deleted

## 2017-03-10 DIAGNOSIS — J309 Allergic rhinitis, unspecified: Secondary | ICD-10-CM

## 2017-03-14 ENCOUNTER — Ambulatory Visit (INDEPENDENT_AMBULATORY_CARE_PROVIDER_SITE_OTHER): Payer: TRICARE For Life (TFL)

## 2017-03-14 DIAGNOSIS — J309 Allergic rhinitis, unspecified: Secondary | ICD-10-CM | POA: Diagnosis not present

## 2017-03-17 ENCOUNTER — Ambulatory Visit (INDEPENDENT_AMBULATORY_CARE_PROVIDER_SITE_OTHER): Payer: TRICARE For Life (TFL) | Admitting: *Deleted

## 2017-03-17 DIAGNOSIS — J309 Allergic rhinitis, unspecified: Secondary | ICD-10-CM

## 2017-03-23 ENCOUNTER — Ambulatory Visit (INDEPENDENT_AMBULATORY_CARE_PROVIDER_SITE_OTHER): Payer: TRICARE For Life (TFL)

## 2017-03-23 ENCOUNTER — Encounter: Payer: Self-pay | Admitting: Radiology

## 2017-03-23 ENCOUNTER — Ambulatory Visit
Admission: RE | Admit: 2017-03-23 | Discharge: 2017-03-23 | Disposition: A | Discharge status: Home / Self Care | Source: Ambulatory Visit | Attending: Emergency Medicine | Admitting: Emergency Medicine

## 2017-03-23 DIAGNOSIS — J309 Allergic rhinitis, unspecified: Secondary | ICD-10-CM | POA: Diagnosis not present

## 2017-03-23 DIAGNOSIS — R1084 Generalized abdominal pain: Secondary | ICD-10-CM

## 2017-03-25 ENCOUNTER — Ambulatory Visit: Admitting: Emergency Medicine

## 2017-03-29 ENCOUNTER — Encounter: Payer: Self-pay | Admitting: Family Medicine

## 2017-03-29 ENCOUNTER — Ambulatory Visit (INDEPENDENT_AMBULATORY_CARE_PROVIDER_SITE_OTHER): Payer: TRICARE For Life (TFL) | Admitting: *Deleted

## 2017-03-29 DIAGNOSIS — J309 Allergic rhinitis, unspecified: Secondary | ICD-10-CM | POA: Diagnosis not present

## 2017-04-04 ENCOUNTER — Encounter: Payer: Self-pay | Admitting: Family Medicine

## 2017-04-04 ENCOUNTER — Other Ambulatory Visit: Payer: Self-pay | Admitting: Radiology

## 2017-04-06 ENCOUNTER — Ambulatory Visit (INDEPENDENT_AMBULATORY_CARE_PROVIDER_SITE_OTHER): Payer: TRICARE For Life (TFL) | Admitting: *Deleted

## 2017-04-06 DIAGNOSIS — J309 Allergic rhinitis, unspecified: Secondary | ICD-10-CM | POA: Diagnosis not present

## 2017-04-14 ENCOUNTER — Ambulatory Visit (INDEPENDENT_AMBULATORY_CARE_PROVIDER_SITE_OTHER): Payer: TRICARE For Life (TFL) | Admitting: *Deleted

## 2017-04-14 DIAGNOSIS — J309 Allergic rhinitis, unspecified: Secondary | ICD-10-CM

## 2017-04-22 ENCOUNTER — Ambulatory Visit (INDEPENDENT_AMBULATORY_CARE_PROVIDER_SITE_OTHER): Payer: TRICARE For Life (TFL)

## 2017-04-22 DIAGNOSIS — J309 Allergic rhinitis, unspecified: Secondary | ICD-10-CM | POA: Diagnosis not present

## 2017-04-26 ENCOUNTER — Ambulatory Visit (INDEPENDENT_AMBULATORY_CARE_PROVIDER_SITE_OTHER): Payer: TRICARE For Life (TFL) | Admitting: *Deleted

## 2017-04-26 DIAGNOSIS — J309 Allergic rhinitis, unspecified: Secondary | ICD-10-CM

## 2017-05-03 ENCOUNTER — Ambulatory Visit: Payer: Self-pay | Admitting: Gastroenterology

## 2017-05-06 ENCOUNTER — Ambulatory Visit (INDEPENDENT_AMBULATORY_CARE_PROVIDER_SITE_OTHER): Payer: TRICARE For Life (TFL)

## 2017-05-06 DIAGNOSIS — J309 Allergic rhinitis, unspecified: Secondary | ICD-10-CM

## 2017-05-12 ENCOUNTER — Ambulatory Visit (INDEPENDENT_AMBULATORY_CARE_PROVIDER_SITE_OTHER): Payer: TRICARE For Life (TFL) | Admitting: *Deleted

## 2017-05-12 DIAGNOSIS — J309 Allergic rhinitis, unspecified: Secondary | ICD-10-CM | POA: Diagnosis not present

## 2017-05-31 ENCOUNTER — Ambulatory Visit (INDEPENDENT_AMBULATORY_CARE_PROVIDER_SITE_OTHER): Payer: TRICARE For Life (TFL) | Admitting: *Deleted

## 2017-05-31 DIAGNOSIS — J309 Allergic rhinitis, unspecified: Secondary | ICD-10-CM

## 2017-06-15 ENCOUNTER — Encounter: Payer: Self-pay | Admitting: *Deleted

## 2017-06-15 DIAGNOSIS — J3089 Other allergic rhinitis: Secondary | ICD-10-CM | POA: Diagnosis not present

## 2017-06-15 NOTE — Progress Notes (Signed)
VIALS MADE. EXP: 06-15-18. HV 

## 2017-06-16 DIAGNOSIS — J301 Allergic rhinitis due to pollen: Secondary | ICD-10-CM | POA: Diagnosis not present

## 2017-06-22 ENCOUNTER — Ambulatory Visit (INDEPENDENT_AMBULATORY_CARE_PROVIDER_SITE_OTHER): Payer: TRICARE For Life (TFL) | Admitting: Emergency Medicine

## 2017-06-22 ENCOUNTER — Encounter: Payer: Self-pay | Admitting: Emergency Medicine

## 2017-06-22 VITALS — BP 126/68 | HR 77 | Temp 98.3°F | Resp 18 | Ht 59.0 in | Wt 146.0 lb

## 2017-06-22 DIAGNOSIS — Z23 Encounter for immunization: Secondary | ICD-10-CM | POA: Diagnosis not present

## 2017-06-22 NOTE — Progress Notes (Signed)
Lorraine Henderson 61 y.o.   Chief Complaint  Patient presents with  . Immunizations    Pertusis    HISTORY OF PRESENT ILLNESS: This is a 61 y.o. female needs Tdap. HPI   Prior to Admission medications   Medication Sig Start Date End Date Taking? Authorizing Provider  EPINEPHrine 0.3 mg/0.3 mL IJ SOAJ injection Inject 0.3 mg into the muscle once.   Yes Provider, Historical, Henderson  fexofenadine (ALLEGRA) 180 MG tablet Take 180 mg by mouth daily.   Yes Provider, Historical, Henderson  Hyoscyamine Sulfate SL (LEVSIN/SL) 0.125 MG SUBL Place 1 tablet under the tongue 3 (three) times daily as needed. 02/23/17  Yes Lorraine Henderson  traMADol (ULTRAM) 50 MG tablet Take 1 tablet (50 mg total) by mouth every 8 (eight) hours as needed. 02/16/17  Yes Lorraine Henderson  atorvastatin (LIPITOR) 40 MG tablet Take 1 tablet (40 mg total) by mouth daily. 09/20/16 12/19/16  Lorraine Henderson  hydrochlorothiazide (HYDRODIURIL) 25 MG tablet Take 1 tablet (25 mg total) by mouth daily. 09/20/16 12/19/16  Lorraine Henderson  mometasone (ELOCON) 0.1 % cream Apply 1 application topically daily. If needed for itchy spots 01/11/17   Lorraine Henderson    Allergies  Allergen Reactions  . Aloe Vera Swelling  . Aspirin Swelling  . Shrimp [Shellfish Allergy] Swelling  . Benicar Hct [Olmesartan Medoxomil-Hctz] Swelling    Patient Active Problem List   Diagnosis Date Noted  . Acute right-sided low back pain with right-sided sciatica 02/16/2017  . Generalized abdominal pain 02/16/2017  . Flu vaccine need 02/16/2017  . Acute gastroenteritis 02/16/2017  . Breast cancer screening 02/16/2017  . Diarrhea of presumed infectious origin 02/16/2017  . History of diverticulosis 02/16/2017    Past Medical History:  Diagnosis Date  . Allergy   . Diverticulitis    colonoscopy 2 years ago: normal  . Hypertension   . Urticaria     Past Surgical History:  Procedure Laterality Date  . ABDOMINAL  HYSTERECTOMY    . CESAREAN SECTION  1991 and 1996  . TONSILLECTOMY Bilateral     Social History   Socioeconomic History  . Marital status: Married    Spouse name: Not on file  . Number of children: Not on file  . Years of education: Not on file  . Highest education level: Not on file  Social Needs  . Financial resource strain: Not on file  . Food insecurity - worry: Not on file  . Food insecurity - inability: Not on file  . Transportation needs - medical: Not on file  . Transportation needs - non-medical: Not on file  Occupational History  . Not on file  Tobacco Use  . Smoking status: Never Smoker  . Smokeless tobacco: Never Used  Substance and Sexual Activity  . Alcohol use: No  . Drug use: No  . Sexual activity: Not on file  Other Topics Concern  . Not on file  Social History Narrative  . Not on file    Family History  Problem Relation Age of Onset  . Cancer Mother   . Diabetes Father   . Stroke Sister      Review of Systems  Constitutional: Negative for chills and fever.  HENT: Negative for sore throat.   Eyes: Negative for discharge and redness.  Respiratory: Negative for shortness of breath.   Gastrointestinal: Negative for nausea and vomiting.  Skin: Negative for rash.    Vitals:   06/22/17  1616  BP: 126/68  Pulse: 77  Resp: 18  Temp: 98.3 F (36.8 C)  SpO2: 98%    Physical Exam  Constitutional: She appears well-developed and well-nourished.  HENT:  Head: Normocephalic.  Eyes: Pupils are equal, round, and reactive to light.  Neck: Normal range of motion.  Cardiovascular: Normal rate.  Pulmonary/Chest: Effort normal.  Musculoskeletal: Normal range of motion.  Neurological: She is alert.  Skin: Skin is warm and dry. Capillary refill takes less than 2 seconds.  Psychiatric: She has a normal mood and affect. Her behavior is normal.  Vitals reviewed.    ASSESSMENT & PLAN: Lorraine Landngela was seen today for immunizations.  Diagnoses and all  orders for this visit:  Need for Tdap vaccination -     Tdap vaccine greater than or equal to 7yo IM   Patient Instructions       IF you received an x-ray today, you will receive an [facility] from [facility]. Please contact [facility] at [phone] with questions or concerns regarding your invoice.   IF you received labwork today, you will receive an invoice from [facility]. Please contact [facility] at [phone] with questions or concerns regarding your invoice.   Our billing staff will not be able to assist you with questions regarding bills from these companies.  You will be contacted with the lab results as soon as they are available. The fastest way to get your results is to activate your My Chart account. Instructions are located on the last page of this paperwork. If you have not heard from us regarding the results in 2 weeks, please contact this office.    DTaP Vaccine (Diphtheria, Tetanus, and Pertussis): What You Need to Know 1. Why get vaccinated? Diphtheria, tetanus, and pertussis are serious diseases caused by bacteria. Diphtheria and pertussis are spread from person to person. Tetanus enters the body through cuts or wounds. DIPHTHERIA causes a thick covering in the back of the throat.  It can lead to breathing problems, paralysis, heart failure, and even death.  TETANUS (Lockjaw) causes painful tightening of the muscles, usually all over the body.  It can lead to "locking" of the jaw so the victim cannot open his mouth or swallow. Tetanus leads to death in up to 2 out of 10 cases.  PERTUSSIS (Whooping Cough) causes coughing spells so bad that it is hard for infants to eat, drink, or breathe. These spells can last for weeks.  It can lead to pneumonia, seizures (jerking and staring spells), brain damage, and death.  Diphtheria, tetanus, and pertussis vaccine (DTaP) can help prevent these diseases. Most children who are vaccinated with DTaP will be  protected throughout childhood. Many more children would get these diseases if we stopped vaccinating. DTaP is a safer version of an older vaccine called DTP. DTP is no longer used in the [place]. 2. Who should get DTaP vaccine and when? Children should get 5 doses of DTaP vaccine, one dose at each of the following ages:  2 months  4 months  6 months  15-18 months  4-6 years  DTaP may be given at the same time as other vaccines. 3. Some children should not get DTaP vaccine or should wait  Children with minor illnesses, such as a cold, may be vaccinated. But children who are moderately or severely ill should usually wait until they recover before getting DTaP vaccine.  Any child who had a life-threatening allergic reaction after a dose of DTaP should not get another dose.  Any child who suffered a brain or nervous system disease within 7 days after a dose of DTaP should not get another dose.  Talk with your doctor if your child: ? had a seizure or collapsed after a dose of DTaP, ? cried non-stop for 3 hours or more after a dose of DTaP, ? had a fever over 105F after a dose of DTaP. Ask your doctor for more information. Some of these children should not get another dose of pertussis vaccine, but may get a vaccine without pertussis, called DT. 4. Older children and adults DTaP is not licensed for adolescents, adults, or children 26 years of age and older. But older people still need protection. A vaccine called Tdap is similar to DTaP. A single dose of Tdap is recommended for people 11 through 63 years of age. Another vaccine, called Td, protects against tetanus and diphtheria, but not pertussis. It is recommended every 10 years. There are separate Vaccine Information Statements for these vaccines. 5. What are the risks from DTaP vaccine? Getting diphtheria, tetanus, or pertussis disease is much riskier than getting DTaP vaccine. However, a vaccine, like any medicine, is capable  of causing serious problems, such as severe allergic reactions. The risk of DTaP vaccine causing serious harm, or death, is extremely small. Mild problems (common)  Fever (up to about 1 child in 4)  Redness or swelling where the shot was given (up to about 1 child in 4)  Soreness or tenderness where the shot was given (up to about 1 child in 4) These problems occur more often after the 4th and 5th doses of the DTaP series than after earlier doses. Sometimes the 4th or 5th dose of DTaP vaccine is followed by swelling of the entire arm or leg in which the shot was given, lasting 1-7 days (up to about 1 child in 30). Other mild problems include:  Fussiness (up to about 1 child in 3)  Tiredness or poor appetite (up to about 1 child in 10)  Vomiting (up to about 1 child in 50) These problems generally occur 1-3 days after the shot. Moderate problems (uncommon)  Seizure (jerking or staring) (about 1 child out of 14,000)  Non-stop crying, for 3 hours or more (up to about 1 child out of 1,000)  High fever, over 105F (about 1 child out of 16,000) Severe problems (very rare)  Serious allergic reaction (less than 1 out of a million doses)  Several other severe problems have been reported after DTaP vaccine. These include: ? Long-term seizures, coma, or lowered consciousness ? Permanent brain damage. These are so rare it is hard to tell if they are caused by the vaccine. Controlling fever is especially important for children who have had seizures, for any reason. It is also important if another family member has had seizures. You can reduce fever and pain by giving your child an aspirin-free pain reliever when the shot is given, and for the next 24 hours, following the package instructions. 6. What if there is a serious reaction? What should I look for? Look for anything that concerns you, such as signs of a severe allergic reaction, very high fever, or behavior changes. Signs of a severe  allergic reaction can include hives, swelling of the face and throat, difficulty breathing, a fast heartbeat, dizziness, and weakness. These would start a few minutes to a few hours after the vaccination. What should I do?  If you think it is a severe allergic reaction or other emergency  that can't wait, call 9-1-1 or get the person to the nearest hospital. Otherwise, call your doctor.  Afterward, the reaction should be reported to the Vaccine Adverse Event Reporting System (VAERS). Your doctor might file this report, or you can do it yourself through the VAERS web site at www.vaers.LAgents.no, or by calling 1-(918)793-5443. ? VAERS is only for reporting reactions. They do not give medical advice. 7. The National Vaccine Injury Compensation Program The [employer] Vaccine Injury Compensation Program (VICP) is a federal program that was created to compensate people who may have been injured by certain vaccines. Persons who believe they may have been injured by a vaccine can learn about the program and about filing a claim by calling 1-860-194-0484 or visiting the VICP website at SpiritualWord.at. 8. How can I learn more?  Ask your doctor.  Call your local or state health department.  Contact the Centers for Disease Control and Prevention (CDC): ? Call 713 375 7997 (1-800-CDC-INFO) or ? Visit CDC's website at PicCapture.uy CDC DTaP Vaccine (Diphtheria, Tetanus, and Pertussis) VIS (11/11/05) This information is not intended to replace advice given to you by your health care provider. Make sure you discuss any questions you have with your health care provider. Document Released: 04/11/2006 Document Revised: 03/04/2016 Document Reviewed: 03/04/2016 Elsevier Interactive Patient Education  2017 Elsevier Inc.      Edwina Barth, [facility]

## 2017-06-22 NOTE — Patient Instructions (Addendum)
   IF you received an x-ray today, you will receive an invoice from [facility]. Please contact [facility] at [phone] with questions or concerns regarding your invoice.   IF you received labwork today, you will receive an invoice from [facility]. Please contact [facility] at [phone] with questions or concerns regarding your invoice.   Our billing staff will not be able to assist you with questions regarding bills from these companies.  You will be contacted with the lab results as soon as they are available. The fastest way to get your results is to activate your My Chart account. Instructions are located on the last page of this paperwork. If you have not heard from us regarding the results in 2 weeks, please contact this office.    DTaP Vaccine (Diphtheria, Tetanus, and Pertussis): What You Need to Know 1. Why get vaccinated? Diphtheria, tetanus, and pertussis are serious diseases caused by bacteria. Diphtheria and pertussis are spread from person to person. Tetanus enters the body through cuts or wounds. DIPHTHERIA causes a thick covering in the back of the throat.  It can lead to breathing problems, paralysis, heart failure, and even death.  TETANUS (Lockjaw) causes painful tightening of the muscles, usually all over the body.  It can lead to "locking" of the jaw so the victim cannot open his mouth or swallow. Tetanus leads to death in up to 2 out of 10 cases.  PERTUSSIS (Whooping Cough) causes coughing spells so bad that it is hard for infants to eat, drink, or breathe. These spells can last for weeks.  It can lead to pneumonia, seizures (jerking and staring spells), brain damage, and death.  Diphtheria, tetanus, and pertussis vaccine (DTaP) can help prevent these diseases. Most children who are vaccinated with DTaP will be protected throughout childhood. Many more children would get these diseases if we stopped vaccinating. DTaP is a safer version of an  older vaccine called DTP. DTP is no longer used in the [place]. 2. Who should get DTaP vaccine and when? Children should get 5 doses of DTaP vaccine, one dose at each of the following ages:  2 months  4 months  6 months  15-18 months  4-6 years  DTaP may be given at the same time as other vaccines. 3. Some children should not get DTaP vaccine or should wait  Children with minor illnesses, such as a cold, may be vaccinated. But children who are moderately or severely ill should usually wait until they recover before getting DTaP vaccine.  Any child who had a life-threatening allergic reaction after a dose of DTaP should not get another dose.  Any child who suffered a brain or nervous system disease within 7 days after a dose of DTaP should not get another dose.  Talk with your doctor if your child: ? had a seizure or collapsed after a dose of DTaP, ? cried non-stop for 3 hours or more after a dose of DTaP, ? had a fever over 105F after a dose of DTaP. Ask your doctor for more information. Some of these children should not get another dose of pertussis vaccine, but may get a vaccine without pertussis, called DT. 4. Older children and adults DTaP is not licensed for adolescents, adults, or children 7 years of age and older. But older people still need protection. A vaccine called Tdap is similar to DTaP. A single dose of Tdap is recommended for people 11 through 64 years of age. Another vaccine, called Td, protects   against tetanus and diphtheria, but not pertussis. It is recommended every 10 years. There are separate Vaccine Information Statements for these vaccines. 5. What are the risks from DTaP vaccine? Getting diphtheria, tetanus, or pertussis disease is much riskier than getting DTaP vaccine. However, a vaccine, like any medicine, is capable of causing serious problems, such as severe allergic reactions. The risk of DTaP vaccine causing serious harm, or death, is extremely  small. Mild problems (common)  Fever (up to about 1 child in 4)  Redness or swelling where the shot was given (up to about 1 child in 4)  Soreness or tenderness where the shot was given (up to about 1 child in 4) These problems occur more often after the 4th and 5th doses of the DTaP series than after earlier doses. Sometimes the 4th or 5th dose of DTaP vaccine is followed by swelling of the entire arm or leg in which the shot was given, lasting 1-7 days (up to about 1 child in 30). Other mild problems include:  Fussiness (up to about 1 child in 3)  Tiredness or poor appetite (up to about 1 child in 10)  Vomiting (up to about 1 child in 50) These problems generally occur 1-3 days after the shot. Moderate problems (uncommon)  Seizure (jerking or staring) (about 1 child out of 14,000)  Non-stop crying, for 3 hours or more (up to about 1 child out of 1,000)  High fever, over 105F (about 1 child out of 16,000) Severe problems (very rare)  Serious allergic reaction (less than 1 out of a million doses)  Several other severe problems have been reported after DTaP vaccine. These include: ? Long-term seizures, coma, or lowered consciousness ? Permanent brain damage. These are so rare it is hard to tell if they are caused by the vaccine. Controlling fever is especially important for children who have had seizures, for any reason. It is also important if another family member has had seizures. You can reduce fever and pain by giving your child an aspirin-free pain reliever when the shot is given, and for the next 24 hours, following the package instructions. 6. What if there is a serious reaction? What should I look for? Look for anything that concerns you, such as signs of a severe allergic reaction, very high fever, or behavior changes. Signs of a severe allergic reaction can include hives, swelling of the face and throat, difficulty breathing, a fast heartbeat, dizziness, and weakness.  These would start a few minutes to a few hours after the vaccination. What should I do?  If you think it is a severe allergic reaction or other emergency that can't wait, call 9-1-1 or get the person to the nearest hospital. Otherwise, call your doctor.  Afterward, the reaction should be reported to the Vaccine Adverse Event Reporting System (VAERS). Your doctor might file this report, or you can do it yourself through the VAERS web site at www.vaers.hhs.gov, or by calling 1-800-822-7967. ? VAERS is only for reporting reactions. They do not give medical advice. 7. The National Vaccine Injury Compensation Program The National Vaccine Injury Compensation Program (VICP) is a federal program that was created to compensate people who may have been injured by certain vaccines. Persons who believe they may have been injured by a vaccine can learn about the program and about filing a claim by calling 1-800-338-2382 or visiting the VICP website at www.hrsa.gov/vaccinecompensation. 8. How can I learn more?  Ask your doctor.  Call your local or state   health department.  Contact the Centers for Disease Control and Prevention (CDC): ? Call 1-800-232-4636 (1-800-CDC-INFO) or ? Visit CDC's website at www.cdc.gov/vaccines CDC DTaP Vaccine (Diphtheria, Tetanus, and Pertussis) VIS (11/11/05) This information is not intended to replace advice given to you by your health care provider. Make sure you discuss any questions you have with your health care provider. Document Released: 04/11/2006 Document Revised: 03/04/2016 Document Reviewed: 03/04/2016 Elsevier Interactive Patient Education  2017 Elsevier Inc.  

## 2017-06-24 ENCOUNTER — Ambulatory Visit (INDEPENDENT_AMBULATORY_CARE_PROVIDER_SITE_OTHER): Payer: TRICARE For Life (TFL)

## 2017-06-24 DIAGNOSIS — J309 Allergic rhinitis, unspecified: Secondary | ICD-10-CM

## 2017-06-30 ENCOUNTER — Encounter: Payer: Self-pay | Admitting: Gastroenterology

## 2017-06-30 ENCOUNTER — Ambulatory Visit (INDEPENDENT_AMBULATORY_CARE_PROVIDER_SITE_OTHER): Admitting: Gastroenterology

## 2017-06-30 VITALS — BP 146/90 | HR 80 | Ht 63.0 in | Wt 148.0 lb

## 2017-06-30 DIAGNOSIS — K219 Gastro-esophageal reflux disease without esophagitis: Secondary | ICD-10-CM

## 2017-06-30 DIAGNOSIS — R1031 Right lower quadrant pain: Secondary | ICD-10-CM

## 2017-06-30 NOTE — Progress Notes (Signed)
[facility] Consult Note:  History: Lorraine Henderson 06/30/2017  Referring physician: Georgina Quint, MD  Reason for consult/chief complaint: Abdominal Pain (aftermeals has upset stomach and pain ); family history of colon cancer (mother had colon cancer ); and Diverticulosis (history of diverticulitis )   Subjective  HPI:  This is a 62 year old woman referred by primary care noted above for abdominal pain.  She seems to describe several months of what sounds like reflux symptoms with some heartburn and regurgitation that tend to bother her more at night.  It seems to occur with certain food triggers, and she might wake up in the morning with "phlegm".  [name] denies dysphagia, odynophagia, or vomiting, but sometimes has morning nausea with the symptoms.  She had a negative urea breath test several months ago, does not recall if she is ever had an upper endoscopy done. As a separate symptom, she has intermittent brief right lower quadrant pain that is nonradiating and for which she received a trial of Levsin several months ago.  This seemed to help because she no longer takes it.  She has a family history of colorectal cancer in her mother, and brought a report of her last colonoscopy in [place] in February 2016, which was reportedly normal.  She was advised to have another colonoscopy at a 5-year interval.  Her bowel habits are regular without rectal bleeding.   ROS:  Review of Systems  Constitutional: Negative for appetite change and unexpected weight change.  HENT: Negative for mouth sores and voice change.   Eyes: Negative for pain and redness.  Respiratory: Negative for cough and shortness of breath.   Cardiovascular: Negative for chest pain and palpitations.  Genitourinary: Negative for dysuria and hematuria.  Musculoskeletal: Negative for arthralgias and myalgias.  Skin: Negative for pallor and rash.  Neurological: Negative for weakness and headaches.    Hematological: Negative for adenopathy.     Past Medical History: Past Medical History:  Diagnosis Date  . Allergy   . Asthma    childhood asthma  . Diverticulitis    colonoscopy 2 years ago: normal  . Hypertension   . Urticaria      Past Surgical History: Past Surgical History:  Procedure Laterality Date  . ABDOMINAL HYSTERECTOMY  01/2009  . CESAREAN SECTION  1991 and 1996  . TONSILLECTOMY Bilateral      Family History: Family History  Problem Relation Age of Onset  . Colon cancer Mother 67  . Ulcerative colitis Mother   . Diabetes Father   . Stroke Sister   . Liver disease Maternal Grandmother   . Breast cancer Maternal Aunt   . Liver cancer Maternal Uncle     Social History: Social History   Socioeconomic History  . Marital status: Married    Spouse name: None  . Number of children: None  . Years of education: None  . Highest education level: None  Social Needs  . Financial resource strain: None  . Food insecurity - worry: None  . Food insecurity - inability: None  . Transportation needs - medical: None  . Transportation needs - non-medical: None  Occupational History  . None  Tobacco Use  . Smoking status: Never Smoker  . Smokeless tobacco: Never Used  Substance and Sexual Activity  . Alcohol use: No  . Drug use: No  . Sexual activity: Yes    Partners: Male  Other Topics Concern  . None  Social History Narrative  . None  Allergies: Allergies  Allergen Reactions  . Aloe Vera Swelling  . Aspirin Swelling  . Shrimp [Shellfish Allergy] Swelling  . Benicar Hct [Olmesartan Medoxomil-Hctz] Swelling    Outpatient Meds: Current Outpatient Medications  Medication Sig Dispense Refill  . EPINEPHrine 0.3 mg/0.3 mL IJ SOAJ injection Inject 0.3 mg into the muscle as needed.     . fexofenadine (ALLEGRA) 180 MG tablet Take 180 mg by mouth as needed.     Marland Kitchen Hyoscyamine Sulfate SL (LEVSIN/SL) 0.125 MG SUBL Place 1 tablet under the tongue 3  (three) times daily as needed. 20 each 1  . atorvastatin (LIPITOR) 40 MG tablet Take 1 tablet (40 mg total) by mouth daily. 90 tablet 3  . hydrochlorothiazide (HYDRODIURIL) 25 MG tablet Take 1 tablet (25 mg total) by mouth daily. 90 tablet 3   No current facility-administered medications for this visit.       ___________________________________________________________________ Objective   Exam:  BP (!) 146/90   Pulse 80   Ht 5\' 3"  (1.6 m)   Wt 148 lb (67.1 kg)   BMI 26.22 kg/m    General: this is a(n) well-appearing woman  Eyes: sclera anicteric, no redness  ENT: oral mucosa moist without lesions, no cervical or supraclavicular lymphadenopathy, good dentition  CV: RRR without murmur, S1/S2, no JVD, no peripheral edema  Resp: clear to auscultation bilaterally, normal RR and effort noted  GI: soft, mild RLQ tenderness, with active bowel sounds. No guarding or palpable organomegaly noted.  Skin; warm and dry, no rash or jaundice noted  Neuro: awake, alert and oriented x 3. Normal gross motor function and fluent speech  Labs:  CBC Latest Ref Rng & Units 02/16/2017 09/20/2016  WBC 3.4 - 10.8 x10E3/uL 6.5 7.4  Hemoglobin 11.1 - 15.9 g/dL 09.8 11.9  Hematocrit 14.7 - 46.6 % 42.1 42.3  Platelets 150 - 379 x10E3/uL 343 368   CMP Latest Ref Rng & Units 02/16/2017 09/20/2016  Glucose 65 - 99 mg/dL 86 97  BUN 8 - 27 mg/dL 11 20  Creatinine 8.29 - 1.00 mg/dL 5.62 1.30  Sodium 865 - 144 mmol/L 142 142  Potassium 3.5 - 5.2 mmol/L 3.8 4.6  Chloride 96 - 106 mmol/L 99 98  CO2 20 - 29 mmol/L 25 29  Calcium 8.7 - 10.3 mg/dL 9.7 9.7  Total Protein 6.0 - 8.5 g/dL 7.4 7.5  Total Bilirubin 0.0 - 1.2 mg/dL 0.5 0.6  Alkaline Phos 39 - 117 IU/L 164(H) 161(H)  AST 0 - 40 IU/L 18 21  ALT 0 - 32 IU/L 15 21   Neg HCV Ab 01/2017  Radiologic Studies:  Abdominal ultrasound in September unremarkable  Assessment: Encounter Diagnoses  Name Primary?  . Gastroesophageal reflux disease,  esophagitis presence not specified Yes  . RLQ abdominal pain     She seems to have reflux symptoms with no red flag symptoms.  I gave her some written diet and lifestyle advice and a trial of Pepcid AC as needed for symptoms.  I do not think an EGD is needed at this point. Her right lower quadrant pain is difficult to characterize, I am not certain if it is GI in origin.  She has had a C-section and a hysterectomy, so there is the possibility of adhesions.  She believes her ovaries remain after previous hysterectomy, and it has been years since she had a gynecologic evaluation.  I referred her to Dr. Colin Broach for a pelvic exam and consideration of pelvic ultrasound just to make sure  there is no right ovarian lesion causing this pain.  She will see me as needed.  Thank you for the courtesy of this consult.  Please call me with any questions or concerns.  Charlie PitterHenry L Danis III  CC: Lorraine QuintSagardia, Miguel Jose, MD  Colin Broachimothy Fontaine, MD

## 2017-06-30 NOTE — Patient Instructions (Signed)
If you are age 64 or older, your body mass index should be between 23-30. Your Body mass index is 26.22 kg/m. If this is out of the aforementioned range listed, please consider follow up with your Primary Care Provider.  If you are age 64 or younger, your body mass index should be between 19-25. Your Body mass index is 26.22 kg/m. If this is out of the aformentioned range listed, please consider follow up with your Primary Care Provider.   Please use over the counter PEPCID AC as needed for reflux symptoms.  You will be due for a recall colonoscopy in 07-2019. We will send you a reminder in the mail when it gets closer to that time.  We will send your records to Dr Reynold BowenFontaine's office. They will call you to schedule an appointment. If you don't hear from them in the next 2 weeks, please callus here at [phone].   Thank you for choosing [facility]  Dr Amada JupiterHenry Danis III

## 2017-07-04 ENCOUNTER — Other Ambulatory Visit: Payer: Self-pay

## 2017-07-04 ENCOUNTER — Ambulatory Visit (INDEPENDENT_AMBULATORY_CARE_PROVIDER_SITE_OTHER): Payer: TRICARE For Life (TFL) | Admitting: *Deleted

## 2017-07-04 DIAGNOSIS — J309 Allergic rhinitis, unspecified: Secondary | ICD-10-CM

## 2017-07-04 DIAGNOSIS — R1031 Right lower quadrant pain: Principal | ICD-10-CM

## 2017-07-04 DIAGNOSIS — G8929 Other chronic pain: Secondary | ICD-10-CM

## 2017-07-14 ENCOUNTER — Ambulatory Visit (INDEPENDENT_AMBULATORY_CARE_PROVIDER_SITE_OTHER): Payer: TRICARE For Life (TFL)

## 2017-07-14 DIAGNOSIS — J309 Allergic rhinitis, unspecified: Secondary | ICD-10-CM

## 2017-07-15 ENCOUNTER — Telehealth: Payer: Self-pay

## 2017-07-15 NOTE — Telephone Encounter (Signed)
On 06-30-2017 [facility] per Dr Myrtie Neitheranis' office visit. I called to follow up today. Pt has not scheduled at this time. Her insurance is out of network. She is looking for a new primary and specialists out of the [facility] network and will have them set her up with a new provider within her coverage.

## 2017-07-15 NOTE — Telephone Encounter (Signed)
Understood, thanks

## 2017-07-20 ENCOUNTER — Ambulatory Visit (INDEPENDENT_AMBULATORY_CARE_PROVIDER_SITE_OTHER): Payer: TRICARE For Life (TFL)

## 2017-07-20 DIAGNOSIS — J309 Allergic rhinitis, unspecified: Secondary | ICD-10-CM | POA: Diagnosis not present

## 2017-07-27 ENCOUNTER — Ambulatory Visit (INDEPENDENT_AMBULATORY_CARE_PROVIDER_SITE_OTHER): Payer: TRICARE For Life (TFL) | Admitting: *Deleted

## 2017-07-27 DIAGNOSIS — J309 Allergic rhinitis, unspecified: Secondary | ICD-10-CM | POA: Diagnosis not present

## 2017-08-05 ENCOUNTER — Ambulatory Visit (INDEPENDENT_AMBULATORY_CARE_PROVIDER_SITE_OTHER): Payer: TRICARE For Life (TFL)

## 2017-08-05 DIAGNOSIS — J309 Allergic rhinitis, unspecified: Secondary | ICD-10-CM

## 2017-08-12 ENCOUNTER — Ambulatory Visit (INDEPENDENT_AMBULATORY_CARE_PROVIDER_SITE_OTHER): Payer: TRICARE For Life (TFL) | Admitting: *Deleted

## 2017-08-12 DIAGNOSIS — J309 Allergic rhinitis, unspecified: Secondary | ICD-10-CM

## 2017-08-19 ENCOUNTER — Ambulatory Visit (INDEPENDENT_AMBULATORY_CARE_PROVIDER_SITE_OTHER)

## 2017-08-19 DIAGNOSIS — J309 Allergic rhinitis, unspecified: Secondary | ICD-10-CM | POA: Diagnosis not present

## 2017-08-26 ENCOUNTER — Ambulatory Visit (INDEPENDENT_AMBULATORY_CARE_PROVIDER_SITE_OTHER): Admitting: *Deleted

## 2017-08-26 DIAGNOSIS — J309 Allergic rhinitis, unspecified: Secondary | ICD-10-CM | POA: Diagnosis not present

## 2017-09-02 ENCOUNTER — Ambulatory Visit (INDEPENDENT_AMBULATORY_CARE_PROVIDER_SITE_OTHER)

## 2017-09-02 DIAGNOSIS — J309 Allergic rhinitis, unspecified: Secondary | ICD-10-CM | POA: Diagnosis not present

## 2017-09-16 ENCOUNTER — Ambulatory Visit (INDEPENDENT_AMBULATORY_CARE_PROVIDER_SITE_OTHER)

## 2017-09-16 DIAGNOSIS — J309 Allergic rhinitis, unspecified: Secondary | ICD-10-CM

## 2017-10-21 ENCOUNTER — Ambulatory Visit (INDEPENDENT_AMBULATORY_CARE_PROVIDER_SITE_OTHER)

## 2017-10-21 DIAGNOSIS — J309 Allergic rhinitis, unspecified: Secondary | ICD-10-CM

## 2017-10-28 ENCOUNTER — Ambulatory Visit (INDEPENDENT_AMBULATORY_CARE_PROVIDER_SITE_OTHER)

## 2017-10-28 DIAGNOSIS — J309 Allergic rhinitis, unspecified: Secondary | ICD-10-CM

## 2017-11-03 ENCOUNTER — Ambulatory Visit (INDEPENDENT_AMBULATORY_CARE_PROVIDER_SITE_OTHER)

## 2017-11-03 DIAGNOSIS — J309 Allergic rhinitis, unspecified: Secondary | ICD-10-CM | POA: Diagnosis not present

## 2017-11-11 ENCOUNTER — Ambulatory Visit (INDEPENDENT_AMBULATORY_CARE_PROVIDER_SITE_OTHER)

## 2017-11-11 DIAGNOSIS — J309 Allergic rhinitis, unspecified: Secondary | ICD-10-CM

## 2017-11-18 ENCOUNTER — Ambulatory Visit (INDEPENDENT_AMBULATORY_CARE_PROVIDER_SITE_OTHER)

## 2017-11-18 DIAGNOSIS — J309 Allergic rhinitis, unspecified: Secondary | ICD-10-CM | POA: Diagnosis not present

## 2017-11-22 ENCOUNTER — Encounter: Payer: Self-pay | Admitting: Family Medicine

## 2017-11-24 NOTE — Progress Notes (Signed)
VIALS EXP 11-26-18 

## 2017-11-25 DIAGNOSIS — J3089 Other allergic rhinitis: Secondary | ICD-10-CM | POA: Diagnosis not present

## 2017-11-28 DIAGNOSIS — J301 Allergic rhinitis due to pollen: Secondary | ICD-10-CM | POA: Diagnosis not present

## 2017-12-07 ENCOUNTER — Ambulatory Visit (INDEPENDENT_AMBULATORY_CARE_PROVIDER_SITE_OTHER)

## 2017-12-07 DIAGNOSIS — J309 Allergic rhinitis, unspecified: Secondary | ICD-10-CM

## 2017-12-16 ENCOUNTER — Ambulatory Visit (INDEPENDENT_AMBULATORY_CARE_PROVIDER_SITE_OTHER)

## 2017-12-16 DIAGNOSIS — J309 Allergic rhinitis, unspecified: Secondary | ICD-10-CM

## 2017-12-23 ENCOUNTER — Ambulatory Visit (INDEPENDENT_AMBULATORY_CARE_PROVIDER_SITE_OTHER)

## 2017-12-23 DIAGNOSIS — J309 Allergic rhinitis, unspecified: Secondary | ICD-10-CM | POA: Diagnosis not present

## 2017-12-27 ENCOUNTER — Ambulatory Visit (INDEPENDENT_AMBULATORY_CARE_PROVIDER_SITE_OTHER): Admitting: *Deleted

## 2017-12-27 DIAGNOSIS — J309 Allergic rhinitis, unspecified: Secondary | ICD-10-CM

## 2018-08-29 IMAGING — US US ABDOMEN COMPLETE
1 series · 14 of 25 positions shown · non-contrast
Comparison: None.

CLINICAL DATA: Generalized abdominal pain for 3 months.

EXAM:
ABDOMEN ULTRASOUND COMPLETE

[Series 1: us abdomen complete · 0.19mm/px · 14 of 85 slices shown]
[im 1/85]
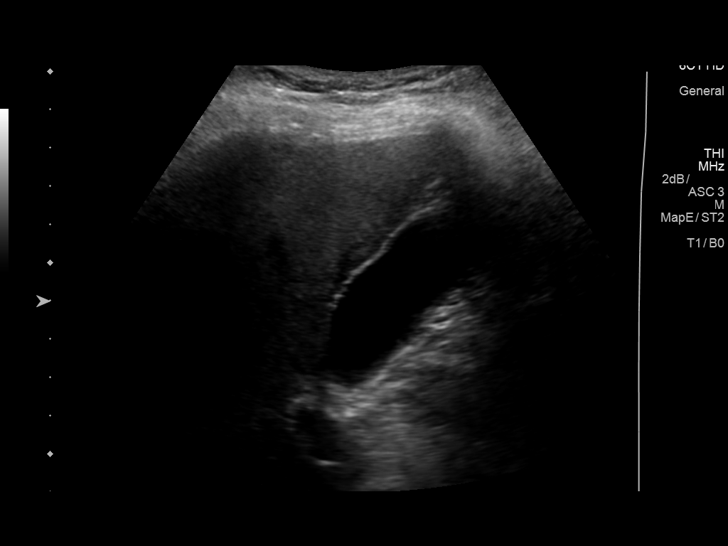
[im 8/85]
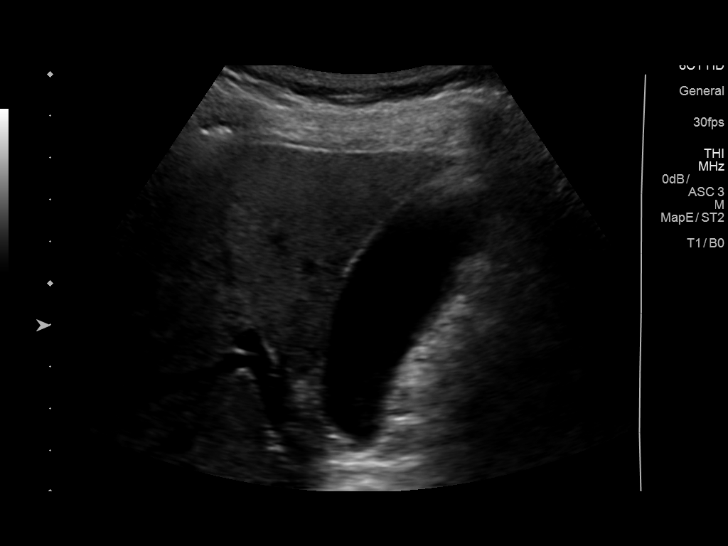
[im 15/85]
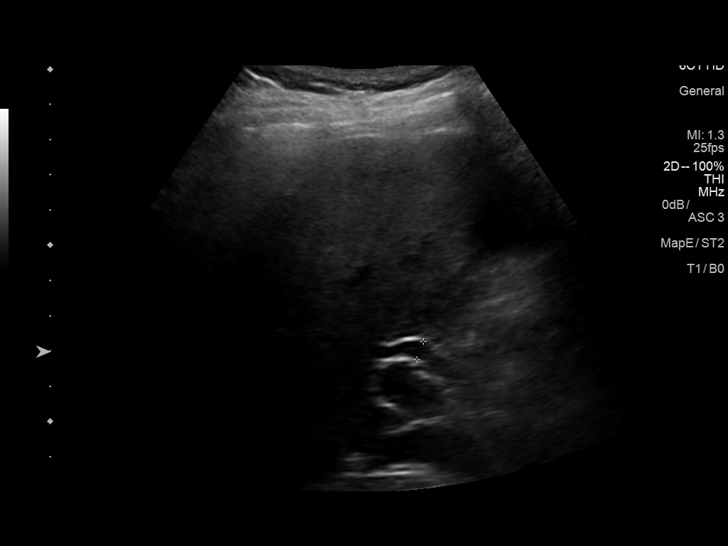
[im 22/85]
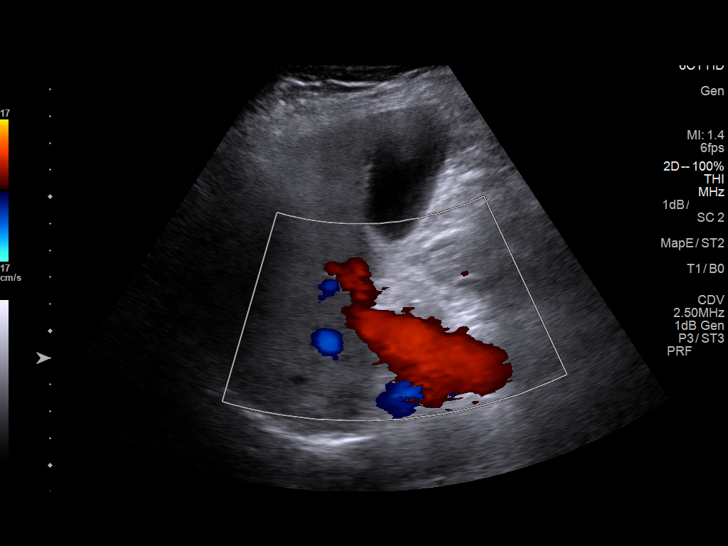
[im 29/85]
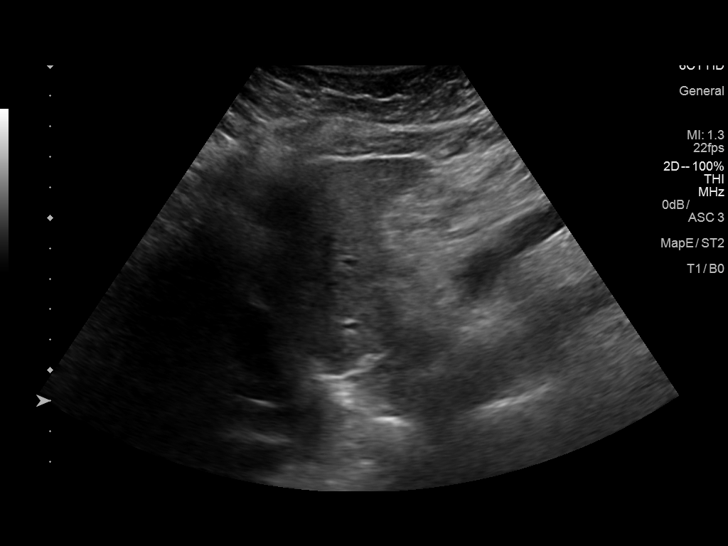
[im 32/85]
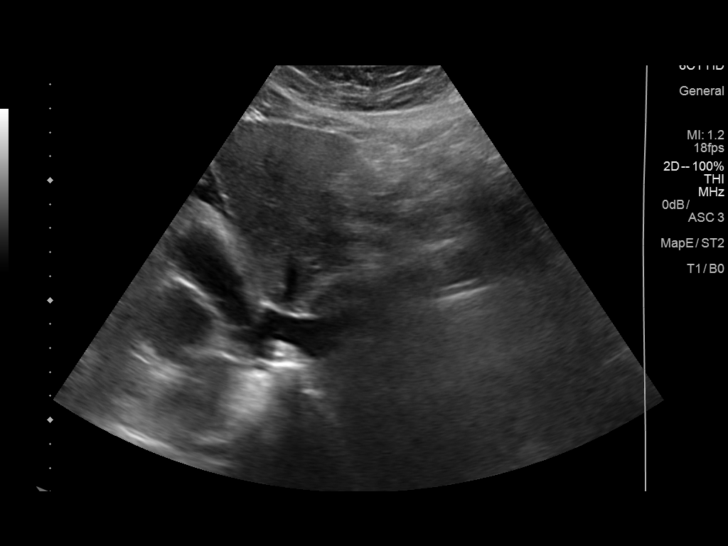
[im 39/85]
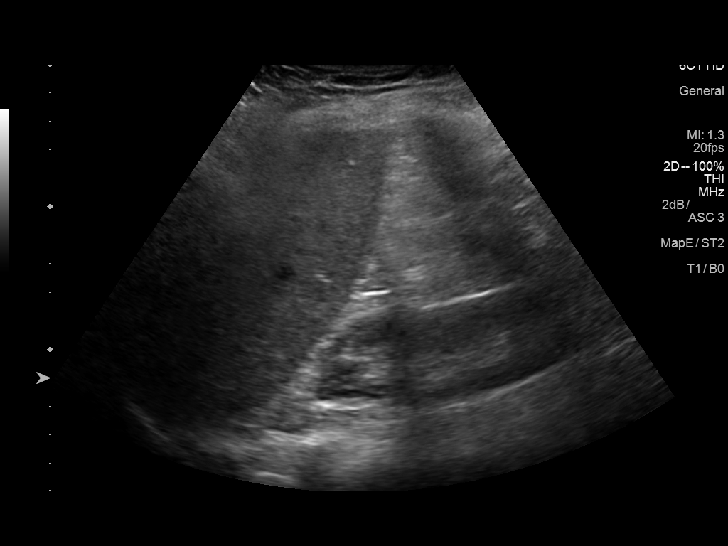
[im 46/85]
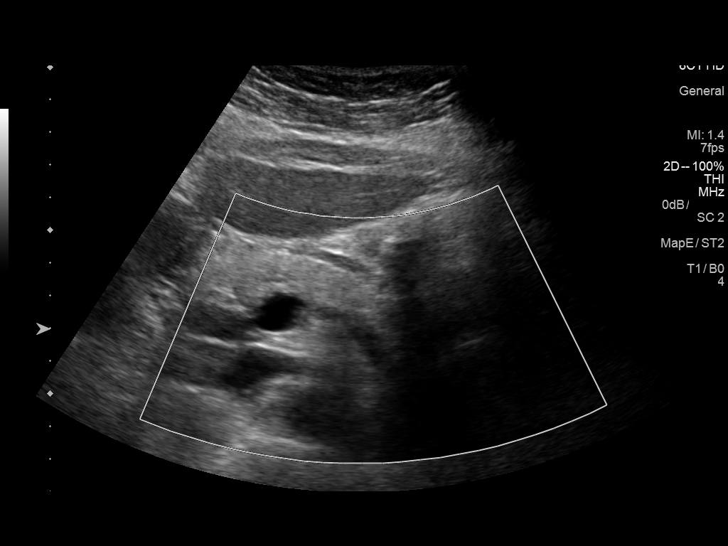
[im 53/85]
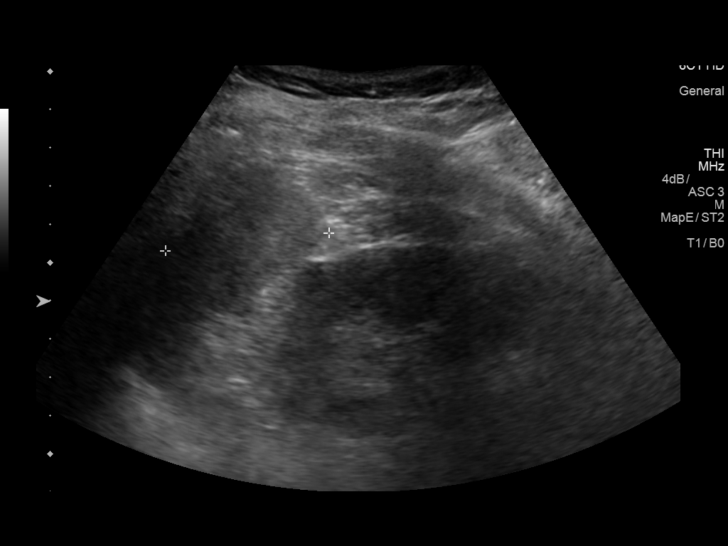
[im 57/85]
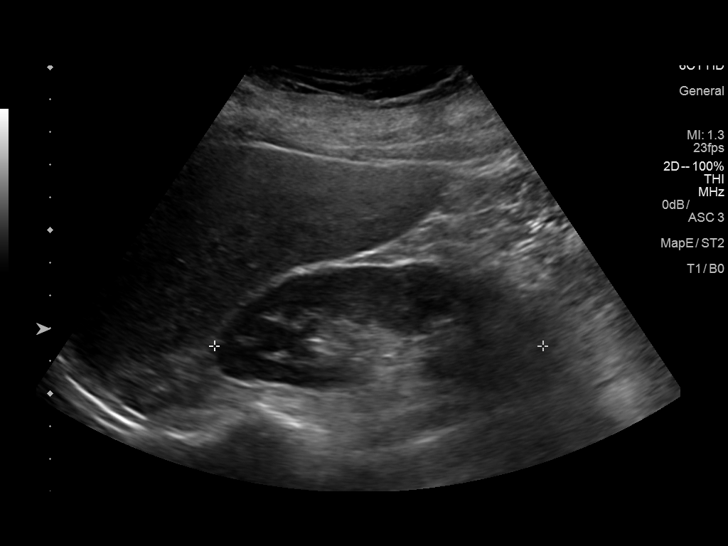
[im 64/85]
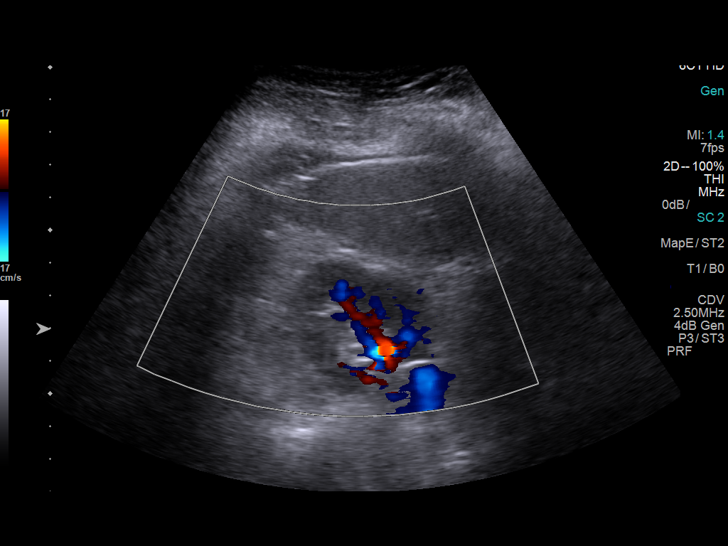
[im 71/85]
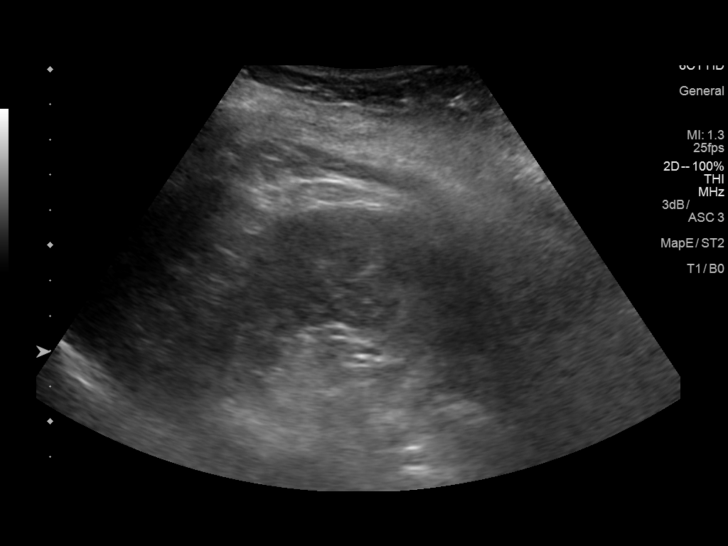
[im 78/85]
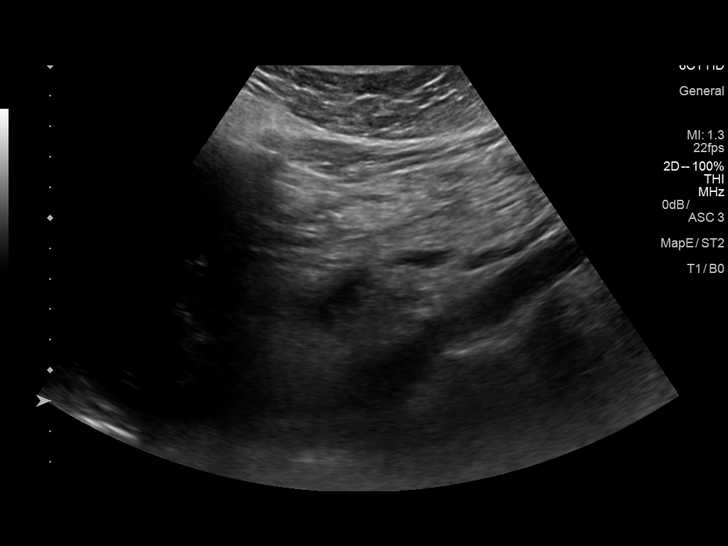
[im 85/85]
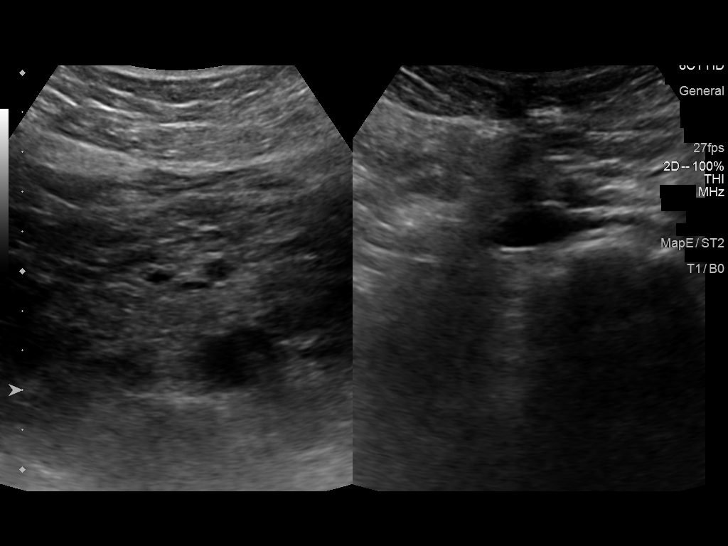

[14 of 25 positions shown; findings below may reference images not displayed]

FINDINGS: Gallbladder: No gallstones or wall thickening visualized. No
sonographic Murphy sign noted by sonographer.

Common bile duct: Diameter: 5.4 mm which is within normal limits.

Liver: No focal lesion identified. Within normal limits in
parenchymal echogenicity. Portal vein is patent on color Doppler
imaging with normal direction of blood flow towards the liver.

IVC: No abnormality visualized.

Pancreas: Visualized portion unremarkable.

Spleen: Size and appearance within normal limits.

Right Kidney: Length: 10.1 cm. Echogenicity within normal limits. No
mass or hydronephrosis visualized.

Left Kidney: Length: 10.6 cm. Echogenicity within normal limits. No
mass or hydronephrosis visualized.

Abdominal aorta: No aneurysm visualized.

Other findings: None.
IMPRESSION: No abnormality seen in the abdomen.

## 2018-09-20 ENCOUNTER — Emergency Department (HOSPITAL_COMMUNITY)

## 2018-09-20 ENCOUNTER — Encounter (HOSPITAL_COMMUNITY): Payer: Self-pay | Admitting: Emergency Medicine

## 2018-09-20 ENCOUNTER — Other Ambulatory Visit: Payer: Self-pay

## 2018-09-20 ENCOUNTER — Emergency Department (HOSPITAL_COMMUNITY)
Admission: EM | Admit: 2018-09-20 | Discharge: 2018-09-20 | Disposition: A | Discharge status: Home / Self Care | Attending: Emergency Medicine | Admitting: Emergency Medicine

## 2018-09-20 DIAGNOSIS — Z79899 Other long term (current) drug therapy: Secondary | ICD-10-CM | POA: Diagnosis not present

## 2018-09-20 DIAGNOSIS — R0602 Shortness of breath: Secondary | ICD-10-CM | POA: Insufficient documentation

## 2018-09-20 DIAGNOSIS — J45909 Unspecified asthma, uncomplicated: Secondary | ICD-10-CM | POA: Insufficient documentation

## 2018-09-20 DIAGNOSIS — I10 Essential (primary) hypertension: Secondary | ICD-10-CM | POA: Diagnosis not present

## 2018-09-20 DIAGNOSIS — R0601 Orthopnea: Secondary | ICD-10-CM | POA: Insufficient documentation

## 2018-09-20 DIAGNOSIS — R1110 Vomiting, unspecified: Secondary | ICD-10-CM | POA: Diagnosis not present

## 2018-09-20 LAB — BASIC METABOLIC PANEL
Anion gap: 10 (ref 5–15)
BUN: 15 mg/dL (ref 8–23)
CO2: 28 mmol/L (ref 22–32)
Calcium: 9.2 mg/dL (ref 8.9–10.3)
Chloride: 103 mmol/L (ref 98–111)
Creatinine, Ser: 0.91 mg/dL (ref 0.44–1.00)
GFR calc Af Amer: >60 mL/min (ref >60)
GFR calc non Af Amer: >60 mL/min (ref >60)
Glucose, Bld: 120 mg/dL — ABNORMAL HIGH (ref 70–99)
POTASSIUM: 3.7 mmol/L (ref 3.5–5.1)
Sodium: 141 mmol/L (ref 135–145)

## 2018-09-20 LAB — CBC WITH DIFFERENTIAL/PLATELET
ABS IMMATURE GRANULOCYTES: 0.02 10*3/uL (ref 0.00–0.07)
BASOS ABS: 0.1 10*3/uL (ref 0.0–0.1)
Basophils Relative: 1 %
Eosinophils Absolute: 0.7 10*3/uL — ABNORMAL HIGH (ref 0.0–0.5)
Eosinophils Relative: 7 %
HCT: 40.1 % (ref 36.0–46.0)
Hemoglobin: 13.9 g/dL (ref 12.0–15.0)
Immature Granulocytes: 0 %
LYMPHS PCT: 28 %
Lymphs Abs: 3 10*3/uL (ref 0.7–4.0)
MCH: 31.3 pg (ref 26.0–34.0)
MCHC: 34.7 g/dL (ref 30.0–36.0)
MCV: 90.3 fL (ref 80.0–100.0)
Monocytes Absolute: 0.8 10*3/uL (ref 0.1–1.0)
Monocytes Relative: 7 %
NRBC: 0 % (ref 0.0–0.2)
Neutro Abs: 6.3 10*3/uL (ref 1.7–7.7)
Neutrophils Relative %: 57 %
Platelets: 292 10*3/uL (ref 150–400)
RBC: 4.44 MIL/uL (ref 3.87–5.11)
RDW: 11.9 % (ref 11.5–15.5)
WBC: 11 10*3/uL — ABNORMAL HIGH (ref 4.0–10.5)

## 2018-09-20 LAB — TROPONIN I

## 2018-09-20 LAB — BRAIN NATRIURETIC PEPTIDE: B Natriuretic Peptide: 14.2 pg/mL (ref 0.0–100.0)

## 2018-09-20 NOTE — ED Triage Notes (Signed)
Pt report SHOB starting 3 hours ago. Pt reports she is unable to lay flat. Pt reports having chills, denies fever. Pt reports vomiting starting tonight. Pt denies sick contact or travel. Pt denies pain.

## 2018-09-20 NOTE — ED Notes (Signed)
Patient transported to X-ray 

## 2018-09-20 NOTE — ED Provider Notes (Signed)
[facility] EMERGENCY DEPARTMENT Provider Note   CSN: [id number] Arrival date & time: 09/20/18  0129    History   Chief Complaint Chief Complaint  Patient presents with  . Shortness of Breath    HPI Lorraine Henderson is a 63 y.o. female.     HPI  63 year old female comes in with chief complaint of shortness of breath. Patient has history of asthma.  She reports that she started having sudden onset shortness of breath about 3 hours ago.  Patient was unable to lay flat at that time and started having chills.  Patient denies any cough, fevers, chest pain.  She did have some nausea and vomiting earlier today.  Throughout the day patient had no problems.  She reported about a month ago she had similar symptoms after she had received flu shot from her PCP.  She denies any sick exposures, recent travel history.  She also denies any wheezing.  Past Medical History:  Diagnosis Date  . Allergy   . Asthma    childhood asthma  . Diverticulitis    colonoscopy 2 years ago: normal  . Hypertension   . Urticaria     Patient Active Problem List   Diagnosis Date Noted  . Acute right-sided low back pain with right-sided sciatica 02/16/2017  . Generalized abdominal pain 02/16/2017  . Flu vaccine need 02/16/2017  . Acute gastroenteritis 02/16/2017  . Breast cancer screening 02/16/2017  . Diarrhea of presumed infectious origin 02/16/2017  . History of diverticulosis 02/16/2017    Past Surgical History:  Procedure Laterality Date  . ABDOMINAL HYSTERECTOMY  01/2009  . CESAREAN SECTION  1991 and 1996  . TONSILLECTOMY Bilateral      OB History   No obstetric history on file.      Home Medications    Prior to Admission medications   Medication Sig Start Date End Date Taking? Authorizing Provider  atorvastatin (LIPITOR) 40 MG tablet Take 1 tablet (40 mg total) by mouth daily. 09/20/16 12/19/16  Georgina Quint, MD  EPINEPHrine 0.3 mg/0.3 mL IJ SOAJ injection  Inject 0.3 mg into the muscle as needed.     [provider]  fexofenadine (ALLEGRA) 180 MG tablet Take 180 mg by mouth as needed.     [provider]  hydrochlorothiazide (HYDRODIURIL) 25 MG tablet Take 1 tablet (25 mg total) by mouth daily. 09/20/16 12/19/16  Georgina Quint, MD  Hyoscyamine Sulfate SL (LEVSIN/SL) 0.125 MG SUBL Place 1 tablet under the tongue 3 (three) times daily as needed. 02/23/17   Georgina Quint, MD    Family History Family History  Problem Relation Age of Onset  . Colon cancer Mother 24  . Ulcerative colitis Mother   . Diabetes Father   . Stroke Sister   . Liver disease Maternal Grandmother   . Breast cancer Maternal Aunt   . Liver cancer Maternal Uncle     Social History Social History   Tobacco Use  . Smoking status: Never Smoker  . Smokeless tobacco: Never Used  Substance Use Topics  . Alcohol use: No  . Drug use: No     Allergies   Aloe vera; Aspirin; Shrimp [shellfish allergy]; and Benicar hct [olmesartan medoxomil-hctz]   Review of Systems Review of Systems  Constitutional: Positive for activity change.  Respiratory: Positive for shortness of breath. Negative for cough and wheezing.   Cardiovascular: Negative for chest pain.  Gastrointestinal: Positive for nausea and vomiting.     Physical Exam Updated  Vital Signs BP 109/63   Pulse 63   Resp 18   SpO2 95%   Physical Exam Vitals signs and nursing note reviewed.  Constitutional:      Appearance: She is well-developed.  HENT:     Head: Normocephalic and atraumatic.  Neck:     Musculoskeletal: Normal range of motion and neck supple.  Cardiovascular:     Rate and Rhythm: Normal rate.  Pulmonary:     Effort: Pulmonary effort is normal.     Breath sounds: No wheezing or rales.  Abdominal:     General: Bowel sounds are normal.  Skin:    General: Skin is warm and dry.  Neurological:     Mental Status: She is alert and oriented to person, place, and  time.      ED Treatments / Results  Labs (all labs ordered are listed, but only abnormal results are displayed) Labs Reviewed  BASIC METABOLIC PANEL - Abnormal; Notable for the following components:      Result Value   Glucose, Bld 120 (*)    All other components within normal limits  CBC WITH DIFFERENTIAL/PLATELET - Abnormal; Notable for the following components:   WBC 11.0 (*)    Eosinophils Absolute 0.7 (*)    All other components within normal limits  BRAIN NATRIURETIC PEPTIDE  TROPONIN I    EKG EKG Interpretation  Date/Time:  Wednesday September 20 2018 01:42:34 EDT Ventricular Rate:  82 PR Interval:    QRS Duration: 100 QT Interval:  405 QTC Calculation: 473 R Axis:   35 Text Interpretation:  Sinus rhythm Low voltage, precordial leads Borderline T abnormalities, diffuse leads No acute changes Confirmed by Derwood Kaplan ([id number]) on 09/20/2018 4:15:11 AM   Radiology Dg Chest 2 View  Result Date: 09/20/2018 CLINICAL DATA:  Shortness of breath. EXAM: CHEST - 2 VIEW COMPARISON:  None. FINDINGS: Upper normal heart size of normal mediastinal contours. The lungs are clear. Pulmonary vasculature is normal. No consolidation, pleural effusion, or pneumothorax. No acute osseous abnormalities are seen. IMPRESSION: Borderline cardiomegaly.  No acute pulmonary process. Electronically Signed   By: Narda Rutherford M.D.   On: 09/20/2018 03:02    Procedures Procedures (including critical care time)  Medications Ordered in ED Medications - No data to display   Initial Impression / Assessment and Plan / ED Course  I have reviewed the triage vital signs and the nursing notes.  Pertinent labs & imaging results that were available during my care of the patient were reviewed by me and considered in my medical decision making (see chart for details).        63 year old female with history of hypertension comes in with chief complaint of shortness of breath.  She also has history of  asthma.  It seems like prior to ED arrival she was having orthopnea-paroxysmal nocturnal dyspnea-like symptoms.  On exam she has no wheezing, no rales.  She has no pitting edema.  Patient denies any chest pain. EKG is reassuring.  This time she has no shortness of breath.  Clinically this does not appear to be infectious process.  Labs ordered and it shows normal BNP, troponin and chest x-ray. Results of the ER work-up discussed with the patient and she will be discharged with strict ER return precautions.  PCP follow-up recommended  Final Clinical Impressions(s) / ED Diagnoses   Final diagnoses:  Orthopnea  Shortness of breath    ED Discharge Orders    None  Derwood Kaplan, MD 09/20/18 [id number]

## 2018-09-20 NOTE — Discharge Instructions (Addendum)
We saw you in the ER for the shortness of breath. All the results in the ER are normal, labs and imaging. We are not sure what is causing your symptoms. The workup in the ER is not complete, and is limited to screening for life threatening and emergent conditions only, so please see a primary care doctor for further evaluation.  Please return to the ER immediately if you start having worsening of your symptoms.  Otherwise follow-up with your primary care doctor in 5 to 7 days.

## 2018-09-20 NOTE — ED Notes (Signed)
Patient verbalizes understanding of discharge instructions. Opportunity for questioning and answers were provided. Armband removed by staff, pt discharged from ED. Wheeled out to lobby  

## 2019-03-02 ENCOUNTER — Other Ambulatory Visit: Payer: Self-pay

## 2019-03-02 DIAGNOSIS — Z20822 Contact with and (suspected) exposure to covid-19: Secondary | ICD-10-CM

## 2019-03-04 LAB — NOVEL CORONAVIRUS, NAA: SARS-CoV-2, NAA: NOT DETECTED
